# Patient Record
Sex: Female | Born: 1975 | Race: White | Hispanic: No | Smoking: Current every day smoker
Health system: Southern US, Community
[De-identification: ages and names within clinical notes are randomized; demographics above are authoritative.]

## PROBLEM LIST (undated history)

## (undated) DIAGNOSIS — T8859XA Other complications of anesthesia, initial encounter: Secondary | ICD-10-CM

## (undated) DIAGNOSIS — R112 Nausea with vomiting, unspecified: Secondary | ICD-10-CM

## (undated) DIAGNOSIS — R519 Headache, unspecified: Secondary | ICD-10-CM

## (undated) DIAGNOSIS — Z9889 Other specified postprocedural states: Secondary | ICD-10-CM

## (undated) DIAGNOSIS — M1990 Unspecified osteoarthritis, unspecified site: Secondary | ICD-10-CM

## (undated) DIAGNOSIS — I10 Essential (primary) hypertension: Secondary | ICD-10-CM

## (undated) DIAGNOSIS — T4145XA Adverse effect of unspecified anesthetic, initial encounter: Secondary | ICD-10-CM

## (undated) HISTORY — PX: TONSILLECTOMY: SUR1361

## (undated) HISTORY — PX: KNEE ARTHROSCOPY: SUR90

## (undated) HISTORY — PX: CHOLECYSTECTOMY: SHX55

---
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
## 1999-03-27 HISTORY — PX: NASAL SINUS SURGERY: SHX719

## 2002-03-26 HISTORY — PX: DIAGNOSTIC LAPAROSCOPY: SUR761

## 2004-07-26 DIAGNOSIS — Z3400 Encounter for supervision of normal first pregnancy, unspecified trimester: Secondary | ICD-10-CM | POA: Insufficient documentation

## 2016-05-30 ENCOUNTER — Ambulatory Visit (INDEPENDENT_AMBULATORY_CARE_PROVIDER_SITE_OTHER): Payer: BLUE CROSS/BLUE SHIELD | Admitting: Orthopaedic Surgery

## 2016-05-30 ENCOUNTER — Ambulatory Visit (INDEPENDENT_AMBULATORY_CARE_PROVIDER_SITE_OTHER): Payer: Self-pay

## 2016-05-30 ENCOUNTER — Encounter (INDEPENDENT_AMBULATORY_CARE_PROVIDER_SITE_OTHER): Payer: Self-pay | Admitting: Orthopaedic Surgery

## 2016-05-30 VITALS — BP 149/69 | HR 90 | Resp 14 | Ht 71.0 in | Wt 240.0 lb

## 2016-05-30 DIAGNOSIS — M25561 Pain in right knee: Secondary | ICD-10-CM

## 2016-05-30 DIAGNOSIS — G8929 Other chronic pain: Secondary | ICD-10-CM

## 2016-05-30 MED ORDER — BUPIVACAINE HCL 0.5 % IJ SOLN
3.0000 mL | INTRAMUSCULAR | Status: AC | PRN
  Administered 2016-05-30: 3 mL via INTRA_ARTICULAR

## 2016-05-30 MED ORDER — LIDOCAINE HCL 1 % IJ SOLN
5.0000 mL | INTRAMUSCULAR | Status: AC | PRN
  Administered 2016-05-30: 5 mL

## 2016-05-30 MED ORDER — METHYLPREDNISOLONE ACETATE 40 MG/ML IJ SUSP
80.0000 mg | INTRAMUSCULAR | Status: AC | PRN
  Administered 2016-05-30: 80 mg

## 2016-05-30 NOTE — Progress Notes (Signed)
Office Visit Note   Patient: Lauren Hicks           Date of Birth: 03/09/1976           MRN: [id number] Visit Date: 05/30/2016              Requested by: No referring provider defined for this encounter. PCP: No primary care provider on file.   Assessment & Plan: Visit Diagnoses: Tricompartmental degenerative arthrosis right knee with predominant findings about the patellofemoral joint   Plan:  Orders are injection about lateral patella, MRI scan, follow-up after scan. I think Herbert SetaHeather might have a loose body and may be a candidate for an arthroscopy. Tramadol for pain at night Follow-Up Instructions: No Follow-up on file.   Orders:  No orders of the defined types were placed in this encounter.  No orders of the defined types were placed in this encounter.     Procedures: Large Joint Inj Date/Time: 05/30/2016 3:31 PM Performed by: Valeria BatmanWHITFIELD, Rio Kidane W Authorized by: Valeria BatmanWHITFIELD, Paton Crum W   Consent Given by:  Patient Timeout: prior to procedure the correct patient, procedure, and site was verified   Indications:  Pain and joint swelling Location:  Knee Site:  R knee Prep: patient was prepped and draped in usual sterile fashion   Needle Size:  25 G Needle Length:  1.5 inches Approach:  Anteromedial Ultrasound Guidance: No   Fluoroscopic Guidance: No   Arthrogram: No   Medications:  5 mL lidocaine 1 %; 80 mg methylPREDNISolone acetate 40 MG/ML; 3 mL bupivacaine 0.5 % Aspiration Attempted: No   Patient tolerance:  Patient tolerated the procedure well with no immediate complications     Clinical Data: No additional findings.   Subjective: Chief Complaint  Patient presents with  . Right Knee - Pain    Pt presents with Right knee pain with 2 arthroscopic surgeries on right knee. One at 41 yrs old and one in 2008. Pain is increasing worse the last 3 weeks and is "locked". Pt relates she has "popping", "gridning" and "giving way".  Unable to walk any distance without  pain.  Pt has not tried cortisone injections in the past, doesn't remember when.  Uses ice, heat, elevation with no relief. Pain travels from lateral knee to achilles area.  Knee arthroscopy in 2008 at [facility] at that time she had lateral patellar tracking and chondromalacia of the patella and now she is experiencing recurrent popping clicking and catching to the point of compromise  Review of Systems   Objective: Vital Signs: There were no vitals taken for this visit.  Physical Exam  Ortho Exam knee exam with small effusion. Significant crepitation along the lateral patella facet with what might be a loose cartilaginous loose body. Considerable tilting of the bed the patella laterally. Some medial lateral joint pain. Difficulty with flexion-extension related to the pain about the patella but does have full extension and flexes over 100. No instability. Pain neurovascular exam intact distally.  Specialty Comments:  No specialty comments available.  Imaging: No results found.   PMFS History: There are no active problems to display for this patient.  No past medical history on file.  No family history on file.  No past surgical history on file. Social History   Occupational History  . Not on file.   Social History Main Topics  . Smoking status: Not on file  . Smokeless tobacco: Not on file  . Alcohol use Not on file  . Drug  use: Unknown  . Sexual activity: Not on file

## 2016-06-05 ENCOUNTER — Encounter: Payer: Self-pay | Admitting: Orthopaedic Surgery

## 2016-06-06 ENCOUNTER — Ambulatory Visit (INDEPENDENT_AMBULATORY_CARE_PROVIDER_SITE_OTHER): Payer: BLUE CROSS/BLUE SHIELD | Admitting: Orthopaedic Surgery

## 2016-06-08 ENCOUNTER — Other Ambulatory Visit (INDEPENDENT_AMBULATORY_CARE_PROVIDER_SITE_OTHER): Payer: Self-pay

## 2016-06-08 MED ORDER — TRAMADOL HCL 50 MG PO TABS: 50.0000 mg | ORAL_TABLET | Freq: Two times a day (BID) | ORAL | 0 refills | Status: DC

## 2016-06-13 ENCOUNTER — Ambulatory Visit (INDEPENDENT_AMBULATORY_CARE_PROVIDER_SITE_OTHER): Payer: BLUE CROSS/BLUE SHIELD | Admitting: Orthopaedic Surgery

## 2016-06-20 ENCOUNTER — Ambulatory Visit (INDEPENDENT_AMBULATORY_CARE_PROVIDER_SITE_OTHER): Payer: BLUE CROSS/BLUE SHIELD | Admitting: Orthopaedic Surgery

## 2016-06-27 ENCOUNTER — Encounter (INDEPENDENT_AMBULATORY_CARE_PROVIDER_SITE_OTHER): Payer: Self-pay | Admitting: Orthopaedic Surgery

## 2016-06-27 ENCOUNTER — Ambulatory Visit (INDEPENDENT_AMBULATORY_CARE_PROVIDER_SITE_OTHER): Payer: BLUE CROSS/BLUE SHIELD | Admitting: Orthopaedic Surgery

## 2016-06-27 VITALS — BP 137/92 | HR 89 | Ht 71.0 in | Wt 240.0 lb

## 2016-06-27 DIAGNOSIS — M25561 Pain in right knee: Secondary | ICD-10-CM

## 2016-06-27 DIAGNOSIS — G8929 Other chronic pain: Secondary | ICD-10-CM | POA: Diagnosis not present

## 2016-06-27 NOTE — Progress Notes (Signed)
Office Visit Note   Patient: Lauren Hicks           Date of Birth: October 20, 1975           MRN: [id number] Visit Date: 06/27/2016              Requested by: No referring provider defined for this encounter. PCP: No PCP Per Patient   Assessment & Plan: Visit Diagnoses: Chronic right knee pain that seems to be a combination of several factors. There are 2 loose bodies measuring 8 and 9 mm in diameter per the MRI scan. There is moderate degenerative change in the medial lateral compartment and severe arthritic change at the patellofemoral joint where there is bone-on-bone along the lateral patella facet and lateral femoral trochlear. There may also be old tear of the lateral meniscus   Plan: Long discussion regarding the findings by MRI scan. She would like to proceed with knee arthroscopy. I discussed this in some detail with her. 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 I might decide to perform a lateral release  Follow-Up Instructions: No Follow-up on file.   Orders:  No orders of the defined types were placed in this encounter.  No orders of the defined types were placed in this encounter.     Procedures: No procedures performed   Clinical Data: No additional findings. M there are moderately degenerative changes throughout the medial lateral compartments. 2 loose bodies were identified along the lateral femoral condyle measuring 0.8 and 0.9 cm in diameter mild subchondral edema in the anterior aspect of the lateral tibial plateau was noted associated with bulky tricompartmental osteophytosis. RI scan was performed at [facility] on 313. There is a multiseptated cyst in the anterior aspect of the lateral compartment measuring 2.7 cm transversely by 1 cm anterior to posterior and 0.9 cm  craniocaudal consistent with apparent meniscal cyst related to capsular surface tear. Ligaments are intact. The patellofemoral joint was completely denuded. There is bone-on-bone joint space narrowing between the lateral patella facet and the lateral femoral trochlear.  Subjective: No chief complaint on file.   [name] is a 41 year old female here today for MRI results    with Tricompartmental degenerative arthrosis right knee with predominant findings about the patellofemoral joint  injection about lateral patella, , Tamadol for pain at night    [name] head several days relief of her knee pain after the cortisone injection only to have it recur. Discussed the reason for obtaining the MRI scan  Review of Systems   Objective: Vital Signs: There were no vitals taken for this visit.  Physical Exam  Ortho Exam no change in the right knee exam from prior visits. Very small effusion. No instability. Negative anterior drawer sign. Mild lateral joint pain. No medial joint pain. Significant patella crepitation with flexion and extension. The loose bodies are palpable the lateral patellofemoral joint where there is some local tenderness. Flexion and flexion over 105. No swelling distally.  Specialty Comments:  No specialty comments available.  Imaging: No results found.   PMFS History: There are no active problems to display for this patient.  No past medical history on file.  Family History  Problem Relation Age of Onset  . Diabetes Mother   . Broken bones Mother   .  Osteoarthritis Father     Past Surgical History:  Procedure Laterality Date  . CESAREAN SECTION    . CHOLECYSTECTOMY    . TONSILLECTOMY     Social History   Occupational History  . Not on file.   Social History Main Topics  . Smoking status: Current Every Day Smoker    Types: Cigarettes  . Smokeless tobacco: Never Used  . Alcohol use No  . Drug use: No  . Sexual activity: No

## 2016-06-28 ENCOUNTER — Telehealth (INDEPENDENT_AMBULATORY_CARE_PROVIDER_SITE_OTHER): Payer: Self-pay | Admitting: Orthopaedic Surgery

## 2016-06-28 NOTE — Telephone Encounter (Signed)
LVM for pt to call to schedule surgery. Will try pt again at a later time. 

## 2016-07-02 ENCOUNTER — Telehealth (INDEPENDENT_AMBULATORY_CARE_PROVIDER_SITE_OTHER): Payer: Self-pay | Admitting: Orthopaedic Surgery

## 2016-07-02 NOTE — Telephone Encounter (Signed)
LVM with pt to call to schedule surgery. Will try pt again at a later time. 

## 2016-07-03 ENCOUNTER — Telehealth (INDEPENDENT_AMBULATORY_CARE_PROVIDER_SITE_OTHER): Payer: Self-pay | Admitting: Orthopaedic Surgery

## 2016-07-03 NOTE — Telephone Encounter (Signed)
LVM x3 with pt trying to schedule surgery. Will await patients phone call to set up surgery.

## 2016-08-13 ENCOUNTER — Other Ambulatory Visit (INDEPENDENT_AMBULATORY_CARE_PROVIDER_SITE_OTHER): Payer: Self-pay | Admitting: Orthopaedic Surgery

## 2016-08-16 ENCOUNTER — Telehealth (INDEPENDENT_AMBULATORY_CARE_PROVIDER_SITE_OTHER): Payer: Self-pay | Admitting: Orthopaedic Surgery

## 2016-08-16 NOTE — Telephone Encounter (Signed)
Patient called wanting to know if her prescription refill has been signed and faxed back to the pharmacy for her tramadol.  [facility] uses [facility] [address] [place], [place].  [phone].  Thank you.

## 2016-08-16 NOTE — Telephone Encounter (Signed)
lvmom that we cannot send rx she has to p/up

## 2016-08-23 ENCOUNTER — Encounter: Payer: Self-pay | Admitting: Orthopaedic Surgery

## 2016-08-23 DIAGNOSIS — M2341 Loose body in knee, right knee: Secondary | ICD-10-CM | POA: Diagnosis not present

## 2016-08-23 DIAGNOSIS — S83281A Other tear of lateral meniscus, current injury, right knee, initial encounter: Secondary | ICD-10-CM | POA: Diagnosis not present

## 2016-08-23 DIAGNOSIS — M2241 Chondromalacia patellae, right knee: Secondary | ICD-10-CM | POA: Diagnosis not present

## 2016-08-29 ENCOUNTER — Ambulatory Visit (INDEPENDENT_AMBULATORY_CARE_PROVIDER_SITE_OTHER): Payer: BLUE CROSS/BLUE SHIELD | Admitting: Orthopaedic Surgery

## 2016-08-29 ENCOUNTER — Encounter (INDEPENDENT_AMBULATORY_CARE_PROVIDER_SITE_OTHER): Payer: Self-pay | Admitting: Orthopaedic Surgery

## 2016-08-29 VITALS — BP 132/84 | HR 80 | Resp 14 | Ht 71.0 in | Wt 240.0 lb

## 2016-08-29 DIAGNOSIS — G8929 Other chronic pain: Secondary | ICD-10-CM

## 2016-08-29 DIAGNOSIS — M25561 Pain in right knee: Secondary | ICD-10-CM

## 2016-08-29 MED ORDER — OXYCODONE-ACETAMINOPHEN 5-325 MG PO TABS: 1.0000 | ORAL_TABLET | Freq: Three times a day (TID) | ORAL | 0 refills | Status: DC | PRN

## 2016-08-29 NOTE — Progress Notes (Signed)
   Post-Op Visit Note   Patient: Lauren Hicks           Date of Birth: 11/27/1975           MRN: [id number] Visit Date: 08/29/2016 PCP: Patient, No Pcp Per   Assessment & Plan:  Chief Complaint:  Chief Complaint  Patient presents with  . Right Knee - Routine Post Op    Lauren Hicks is 41 y o that is 1 week status post RIGHT knee arthroscopy. She ambulates with crutches.Pt relates the Oxycodone makes her "wired" and hard to sleep.   Visit Diagnoses:  1. Chronic pain of right knee    Still having some pain 1 week after he had knee arthroscopy. There was a tear of the medial meniscus but also considerable degenerative changes in all 3 compartments Plan: I aspirated the right knee of about 10 mL of bloody fluid. No evidence of infection. Progressive weightbearing office 1 week. Renew prescription for hydrocodone  Follow-Up Instructions: Return in about 1 week (around 09/05/2016).   Orders:  No orders of the defined types were placed in this encounter.  Meds ordered this encounter  Medications  . oxyCODONE-acetaminophen (PERCOCET/ROXICET) 5-325 MG tablet    Sig: Take 1-2 tablets by mouth every 8 (eight) hours as needed for severe pain.    Dispense:  30 tablet    Refill:  0    Imaging: No results found.  PMFS History: There are no active problems to display for this patient.  History reviewed. No pertinent past medical history.  Family History  Problem Relation Age of Onset  . Diabetes Mother   . Broken bones Mother   . Osteoarthritis Father     Past Surgical History:  Procedure Laterality Date  . CESAREAN SECTION    . CHOLECYSTECTOMY    . TONSILLECTOMY     Social History   Occupational History  . Not on file.   Social History Main Topics  . Smoking status: Current Every Day Smoker    Types: Cigarettes  . Smokeless tobacco: Never Used  . Alcohol use No  . Drug use: No  . Sexual activity: No

## 2016-09-05 ENCOUNTER — Encounter (INDEPENDENT_AMBULATORY_CARE_PROVIDER_SITE_OTHER): Payer: Self-pay | Admitting: Orthopaedic Surgery

## 2016-09-05 ENCOUNTER — Ambulatory Visit (INDEPENDENT_AMBULATORY_CARE_PROVIDER_SITE_OTHER): Payer: BLUE CROSS/BLUE SHIELD | Admitting: Orthopaedic Surgery

## 2016-09-05 VITALS — BP 127/80 | HR 83 | Ht 71.0 in | Wt 240.0 lb

## 2016-09-05 DIAGNOSIS — M25561 Pain in right knee: Secondary | ICD-10-CM

## 2016-09-05 DIAGNOSIS — G8929 Other chronic pain: Secondary | ICD-10-CM

## 2016-09-05 NOTE — Progress Notes (Signed)
   Office Visit Note   Patient: Lauren Hicks           Date of Birth: 01/07/1976           MRN: [id number] Visit Date: 09/05/2016              Requested by: No referring provider defined for this encounter. PCP: Patient, No Pcp Per   Assessment & Plan: Visit Diagnoses:  1. Chronic pain of right knee   Several weeks status post right knee arthroscopy for a torn medial meniscus. There were considerable degenerative changes in the medial compartment. Feeling much better this week Plan: Physical therapy for range of motion right knee, progressively increase weightbearing and discontinue crutches. Office 2 weeks  Follow-Up Instructions: Return in about 2 weeks (around 09/19/2016).   Orders:  No orders of the defined types were placed in this encounter.  No orders of the defined types were placed in this encounter.     Procedures: No procedures performed   Clinical Data: No additional findings.   Subjective: Chief Complaint  Patient presents with  . Right Knee - Routine Post Op    Ms. Lauren Hicks is status post 2 weeks R knee arthroscopy. She relates she is doing much better.   Feeling much better this week after aspiration of the right knee last week. No fever or chills. No calf pain. Still using crutches. 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 No calf pain. No distal edema. Neurovascular exam intact. Lacks full extension related to a small knee effusion and flexes about 100. Normal medial joint pain.  Specialty Comments:  No specialty comments available.  Imaging: No results found.   PMFS History: There are no active problems to display for this patient.  History reviewed. No pertinent past medical history.  Family History   Problem Relation Age of Onset  . Diabetes Mother   . Broken bones Mother   . Osteoarthritis Father     Past Surgical History:  Procedure Laterality Date  . CESAREAN SECTION    . CHOLECYSTECTOMY    . TONSILLECTOMY     Social History   Occupational History  . Not on file.   Social History Main Topics  . Smoking status: Current Every Day Smoker    Types: Cigarettes  . Smokeless tobacco: Never Used  . Alcohol use No  . Drug use: No  . Sexual activity: No     Lauren BatmanPeter W Gardiner Espana, MD   Note - This record has been created using [employer] software.  Chart creation errors have been sought, but may not always  have been located. Such creation errors do not reflect on  the standard of medical care.

## 2016-09-19 ENCOUNTER — Ambulatory Visit (INDEPENDENT_AMBULATORY_CARE_PROVIDER_SITE_OTHER): Payer: BLUE CROSS/BLUE SHIELD | Admitting: Orthopaedic Surgery

## 2016-09-19 DIAGNOSIS — M25561 Pain in right knee: Secondary | ICD-10-CM

## 2016-09-19 DIAGNOSIS — G8929 Other chronic pain: Secondary | ICD-10-CM

## 2016-09-19 NOTE — Progress Notes (Signed)
   Post-Op Visit Note   Patient: Lauren Hicks           Date of Birth: 07/28/1975           MRN: [id number] Visit Date: 09/19/2016 PCP: Patient, No Pcp Per   Assessment & Plan:  Chief Complaint: No chief complaint on file.  Visit Diagnoses:  1. Chronic pain of right knee   3 weeks status post right knee arthroscopy for the tear the medial meniscus. Also were noted tricompartmental degenerative changes  Plan: Progressive weightbearing as tolerated, discontinue crutches, exercises office 1 month. No evidence of DVT. No swelling distally. Minimal effusion Follow-Up Instructions: Return in about 1 month (around 10/19/2016).   Orders:  No orders of the defined types were placed in this encounter.  No orders of the defined types were placed in this encounter.   Imaging: No results found.  PMFS History: There are no active problems to display for this patient.  No past medical history on file.  Family History  Problem Relation Age of Onset  . Diabetes Mother   . Broken bones Mother   . Osteoarthritis Father     Past Surgical History:  Procedure Laterality Date  . CESAREAN SECTION    . CHOLECYSTECTOMY    . TONSILLECTOMY     Social History   Occupational History  . Not on file.   Social History Main Topics  . Smoking status: Current Every Day Smoker    Types: Cigarettes  . Smokeless tobacco: Never Used  . Alcohol use No  . Drug use: No  . Sexual activity: No

## 2016-10-24 ENCOUNTER — Ambulatory Visit (INDEPENDENT_AMBULATORY_CARE_PROVIDER_SITE_OTHER): Payer: BLUE CROSS/BLUE SHIELD | Admitting: Orthopaedic Surgery

## 2016-10-24 DIAGNOSIS — M25561 Pain in right knee: Secondary | ICD-10-CM

## 2016-10-24 DIAGNOSIS — G8929 Other chronic pain: Secondary | ICD-10-CM

## 2016-10-24 MED ORDER — METHYLPREDNISOLONE ACETATE 40 MG/ML IJ SUSP
80.0000 mg | INTRAMUSCULAR | Status: AC | PRN
  Administered 2016-10-24: 80 mg

## 2016-10-24 MED ORDER — BUPIVACAINE HCL 0.5 % IJ SOLN
3.0000 mL | INTRAMUSCULAR | Status: AC | PRN
  Administered 2016-10-24: 3 mL via INTRA_ARTICULAR

## 2016-10-24 MED ORDER — LIDOCAINE HCL 1 % IJ SOLN
5.0000 mL | INTRAMUSCULAR | Status: AC | PRN
  Administered 2016-10-24: 5 mL

## 2016-10-24 NOTE — Progress Notes (Signed)
Office Visit Note   Patient: Alroy DustHeather L Ibe           Date of Birth: 03/31/1975           MRN: [id number] Visit Date: 10/24/2016              Requested by: No referring provider defined for this encounter. PCP: Patient, No Pcp Per   Assessment & Plan: Visit Diagnoses:  1. Chronic pain of right knee   2 months status post right knee arthroscopy with tear of medial meniscus and tricompartmental degenerative arthritis  Plan: Aspirate right knee, cortisone injection. See back in the office 1 month  Follow-Up Instructions: Return in about 1 month (around 11/24/2016).   Orders:  No orders of the defined types were placed in this encounter.  No orders of the defined types were placed in this encounter.     Procedures: Large Joint Inj Date/Time: 10/24/2016 12:19 PM Performed by: Valeria BatmanWHITFIELD, Leonda Cristo W Authorized by: Valeria BatmanWHITFIELD, Seven Dollens W   Consent Given by:  Patient Timeout: prior to procedure the correct patient, procedure, and site was verified   Indications:  Pain and joint swelling Location:  Knee Site:  R knee Prep: patient was prepped and draped in usual sterile fashion   Needle Size:  25 G Needle Length:  1.5 inches Approach:  Anteromedial Ultrasound Guidance: No   Fluoroscopic Guidance: No   Arthrogram: No   Medications:  3 mL bupivacaine 0.5 %; 5 mL lidocaine 1 %; 80 mg methylPREDNISolone acetate 40 MG/ML Aspiration Attempted: Yes   Aspirate amount (mL):  16 Aspirate:  Blood-tinged Patient tolerance:  Patient tolerated the procedure well with no immediate complications      Clinical Data: No additional findings.   Subjective: Chief Complaint  Patient presents with  . Right Knee - Routine Post Op  2 months status post right knee arthroscopy as previously outlined. She is able to drive with less discomfort but still having some pain when she is on her feet for a length of time. No fever or chills.  HPI  Review of Systems   Objective: Vital Signs: There  were no vitals taken for this visit.  Physical Exam  Ortho Exam positive effusion right knee with mild medial joint pain. No patellar discomfort. Full extension. Flexion to 90. No popliteal pain or mass. No calf discomfort. Neurovascular exam intact. No swelling distally area and straight leg raise negative. Painless range of motion both hips  Specialty Comments:  No specialty comments available.  Imaging: No results found.   PMFS History: There are no active problems to display for this patient.  No past medical history on file.  Family History  Problem Relation Age of Onset  . Diabetes Mother   . Broken bones Mother   . Osteoarthritis Father     Past Surgical History:  Procedure Laterality Date  . CESAREAN SECTION    . CHOLECYSTECTOMY    . TONSILLECTOMY     Social History   Occupational History  . Not on file.   Social History Main Topics  . Smoking status: Current Every Day Smoker    Types: Cigarettes  . Smokeless tobacco: Never Used  . Alcohol use No  . Drug use: No  . Sexual activity: No     Valeria BatmanPeter W Caz Weaver, MD   Note - This record has been created using [employer] software.  Chart creation errors have been sought, but may not always  have been located. Such creation errors do not  reflect on  the standard of medical care.

## 2016-11-28 ENCOUNTER — Ambulatory Visit (INDEPENDENT_AMBULATORY_CARE_PROVIDER_SITE_OTHER): Payer: BLUE CROSS/BLUE SHIELD | Admitting: Orthopaedic Surgery

## 2016-11-28 ENCOUNTER — Encounter (INDEPENDENT_AMBULATORY_CARE_PROVIDER_SITE_OTHER): Payer: Self-pay | Admitting: Orthopaedic Surgery

## 2016-11-28 VITALS — BP 135/83 | HR 90 | Ht 71.0 in | Wt 225.0 lb

## 2016-11-28 DIAGNOSIS — M1711 Unilateral primary osteoarthritis, right knee: Secondary | ICD-10-CM

## 2016-11-28 NOTE — Progress Notes (Signed)
   Office Visit Note   Patient: Lauren Hicks           Date of Birth: 06/25/1975           MRN: [id number] Visit Date: 11/28/2016              Requested by: No referring provider defined for this encounter. PCP: Patient, No Pcp Per   Assessment & Plan: Visit Diagnoses:  1. Unilateral primary osteoarthritis, right knee     Plan: long discussion re wt loss, exercises and extent of OA right knee. Cortisone not helpful. Will pre cert for euflexxa or equivalent  Follow-Up Instructions: Return premcert visco.   Orders:  No orders of the defined types were placed in this encounter.  No orders of the defined types were placed in this encounter.     Procedures: No procedures performed   Clinical Data: No additional findings.   Subjective: Chief Complaint  Patient presents with  . Right Knee - Routine Post Op    Ms. Lauren Hicks is a 41 year old that is 3 month status post R knee arthroscopy. She relates the knee has been hurting and the cortisone injection did not work long,    HPI  Review of Systems  Constitutional: Positive for fatigue. Negative for chills and fever.  Eyes: Negative for itching.  Respiratory: Negative for chest tightness and shortness of breath.   Cardiovascular: Positive for leg swelling. Negative for chest pain and palpitations.  Gastrointestinal: Negative for blood in stool, constipation and diarrhea.  Musculoskeletal: Negative for back pain, joint swelling, neck pain and neck stiffness.  Neurological: Positive for light-headedness. Negative for dizziness, weakness, numbness and headaches.  Hematological: Does not bruise/bleed easily.  Psychiatric/Behavioral: Positive for sleep disturbance. The patient is not nervous/anxious.      Objective: Vital Signs: BP 135/83   Pulse 90   Ht 5\' 11"  (1.803 m)   Wt 225 lb (102.1 kg)   BMI 31.38 kg/m   Physical Exam  Ortho Examright knee without increased heat. Skin intact. No effusion. ROM 0-105.  Positive patella crepitation and pain with compression. Mild med and lateral joint pain. No calf pain or distal edema. N/V intact  Specialty Comments:  No specialty comments available.  Imaging: No results found.   PMFS History: There are no active problems to display for this patient.  History reviewed. No pertinent past medical history.  Family History  Problem Relation Age of Onset  . Diabetes Mother   . Broken bones Mother   . Osteoarthritis Father   . Pancreatitis Father     Past Surgical History:  Procedure Laterality Date  . CESAREAN SECTION    . CHOLECYSTECTOMY    . TONSILLECTOMY     Social History   Occupational History  . Not on file.   Social History Main Topics  . Smoking status: Current Every Day Smoker    Types: Cigarettes  . Smokeless tobacco: Never Used  . Alcohol use No  . Drug use: No  . Sexual activity: No

## 2017-11-01 DIAGNOSIS — Z8742 Personal history of other diseases of the female genital tract: Secondary | ICD-10-CM | POA: Insufficient documentation

## 2018-07-14 ENCOUNTER — Ambulatory Visit (INDEPENDENT_AMBULATORY_CARE_PROVIDER_SITE_OTHER): Payer: Self-pay

## 2018-07-14 ENCOUNTER — Encounter (INDEPENDENT_AMBULATORY_CARE_PROVIDER_SITE_OTHER): Payer: Self-pay | Admitting: Family Medicine

## 2018-07-14 ENCOUNTER — Ambulatory Visit (INDEPENDENT_AMBULATORY_CARE_PROVIDER_SITE_OTHER): Payer: BLUE CROSS/BLUE SHIELD | Admitting: Family Medicine

## 2018-07-14 ENCOUNTER — Other Ambulatory Visit: Payer: Self-pay

## 2018-07-14 DIAGNOSIS — R002 Palpitations: Secondary | ICD-10-CM | POA: Insufficient documentation

## 2018-07-14 DIAGNOSIS — M25562 Pain in left knee: Secondary | ICD-10-CM

## 2018-07-14 MED ORDER — NABUMETONE 750 MG PO TABS
750.0000 mg | ORAL_TABLET | Freq: Two times a day (BID) | ORAL | 6 refills | Status: DC | PRN
Start: 2018-07-14 — End: 2018-09-17

## 2018-07-14 MED ORDER — TRAMADOL HCL 50 MG PO TABS: 50.0000 mg | ORAL_TABLET | Freq: Four times a day (QID) | ORAL | 0 refills | Status: DC | PRN

## 2018-07-14 NOTE — Progress Notes (Signed)
   Office Visit Note   Patient: Lauren Hicks           Date of Birth: 12-Jul-1975           MRN: [id number] Visit Date: 07/14/2018 Requested by: No referring provider defined for this encounter. PCP: Patient, No Pcp Per  Subjective: Chief Complaint  Patient presents with  . Left Knee - Pain    Pain started evening of 07/11/2018, after doing yardwork earlier that day. Hurts to bear weight. Pain medial aspect. Swelling. H/o arthroscopic surgery x 2 (cartilage and MCL tear).    HPI: She is here with left knee pain.  Symptoms started 3 days ago after doing yard work.  She does not recall any injury during the day, but it was very swollen and painful later on and she can hardly bear weight.  Pain is on the medial aspect, no locking or catching but it feels like it wants to give way.  She does have a history of arthritis in her knees and also a history of possible pseudogout in the right knee.               ROS: No fevers or chills, no respiratory symptoms.  All other systems were reviewed and are negative.  Objective: Vital Signs: There were no vitals taken for this visit.  Physical Exam:  General:  Alert and oriented, in no acute distress. Pulm:  Breathing unlabored. Psy:  Normal mood, congruent affect. Skin: No rash on her skin, no erythema. Right knee: 2+ effusion with slight warmth.  Full active extension, flexion limited to about 90 degrees.  Extremely tender on the medial joint line, no palpable click with McMurray's.  Imaging: X-rays left knee: Moderate to severe tricompartmental DJD, especially in the patellofemoral joint.  Assessment & Plan: 1.  Left knee pain with effusion, probably due to DJD but cannot rule out pseudogout. -Discussed options with patient, she is reluctant to try cortisone injection.  We will try anti-inflammatory, tramadol as needed.  We will also attempt to get approval for bilateral gel injections for her knees.     Procedures: No procedures  performed  No notes on file     PMFS History: Patient Active Problem List   Diagnosis Date Noted  . Palpitations 07/14/2018  . History of endometriosis 11/01/2017  . Abnormal maternal glucose tolerance, antepartum 07/26/2004  . Supervision of normal first pregnancy 07/26/2004   History reviewed. No pertinent past medical history.  Family History  Problem Relation Age of Onset  . Diabetes Mother   . Broken bones Mother   . Osteoarthritis Father   . Pancreatitis Father     Past Surgical History:  Procedure Laterality Date  . CESAREAN SECTION    . CHOLECYSTECTOMY    . TONSILLECTOMY     Social History   Occupational History  . Not on file  Tobacco Use  . Smoking status: Current Every Day Smoker    Types: Cigarettes  . Smokeless tobacco: Never Used  Substance and Sexual Activity  . Alcohol use: No  . Drug use: No  . Sexual activity: Never

## 2018-07-22 ENCOUNTER — Telehealth (INDEPENDENT_AMBULATORY_CARE_PROVIDER_SITE_OTHER): Payer: Self-pay

## 2018-07-22 NOTE — Telephone Encounter (Signed)
Submitted VOB for SynviscOne, bilateral knee. 

## 2018-07-23 ENCOUNTER — Telehealth (INDEPENDENT_AMBULATORY_CARE_PROVIDER_SITE_OTHER): Payer: Self-pay

## 2018-07-23 NOTE — Telephone Encounter (Signed)
Patient's insurance [employer] does not cover gel injections.  Please advise on the next step for patient.  Thank You.

## 2018-07-24 NOTE — Telephone Encounter (Signed)
Cortisone would be approved by insurance. PRP is an option that would not be approved, but would probably cost less than gel injection.

## 2018-07-25 NOTE — Telephone Encounter (Signed)
I advised the patient. She is not interested in cortisone injections - "they just don't work." She will check back with Dr. Cleophas Dunker to see if knee replacement is possible for her now.

## 2018-07-31 ENCOUNTER — Other Ambulatory Visit (INDEPENDENT_AMBULATORY_CARE_PROVIDER_SITE_OTHER): Payer: Self-pay | Admitting: Family Medicine

## 2018-07-31 ENCOUNTER — Ambulatory Visit (INDEPENDENT_AMBULATORY_CARE_PROVIDER_SITE_OTHER): Payer: BLUE CROSS/BLUE SHIELD | Admitting: Family Medicine

## 2018-07-31 ENCOUNTER — Encounter: Payer: Self-pay | Admitting: Family Medicine

## 2018-07-31 ENCOUNTER — Other Ambulatory Visit: Payer: Self-pay

## 2018-07-31 DIAGNOSIS — M25562 Pain in left knee: Secondary | ICD-10-CM

## 2018-07-31 DIAGNOSIS — M2392 Unspecified internal derangement of left knee: Secondary | ICD-10-CM

## 2018-07-31 NOTE — Progress Notes (Signed)
   Office Visit Note   Patient: Lauren Hicks           Date of Birth: Jul 21, 1975           MRN: [id number] Visit Date: 07/31/2018 Requested by: No referring provider defined for this encounter. PCP: Patient, No Pcp Per  Subjective: Chief Complaint  Patient presents with  . Left Knee - Follow-up    Knee locked in mildly flexed position yesterday, with severe pain. Can move knee today, and put weight on it with crutches.    HPI: She is here with worsening left knee pain.  Yesterday without warning it locked in a flexed position.  After a while it unlocked, but it has been catching since then.  It is very painful to move it, and she feels concerned that she might fall if it locks while she is walking.  She is using crutches right now.              ROS: No fevers or chills, no respiratory symptoms.  All other systems were reviewed and are negative.  Objective: Vital Signs: There were no vitals taken for this visit.  Physical Exam:  General:  Alert and oriented, in no acute distress. Pulm:  Breathing unlabored. Psy:  Normal mood, congruent affect. Skin: No rash on her skin. Left knee: 1-2+ effusion with no warmth or erythema.  Very painful with active and passive range of motion, unable to fully extend it today.  Imaging: None today.  Assessment & Plan: 1.  Left knee effusion with locking, concerning for loose body or meniscus tear. -Discussed options with patient and elected to aspirate the knee today, injected with lidocaine, and see how she does.  We will proceed with MRI scan if it continues to lock.     Procedures: Left knee aspiration: After sterile prep with Betadine, injected 3 cc 1% lidocaine without epinephrine, then aspirated 15 cc of blood tinged slightly hazy synovial fluid, then injected 8 cc 1% lidocaine without epinephrine intra-articularly.    PMFS History: Patient Active Problem List   Diagnosis Date Noted  . Palpitations 07/14/2018  . History of  endometriosis 11/01/2017  . Abnormal maternal glucose tolerance, antepartum 07/26/2004  . Supervision of normal first pregnancy 07/26/2004   History reviewed. No pertinent past medical history.  Family History  Problem Relation Age of Onset  . Diabetes Mother   . Broken bones Mother   . Osteoarthritis Father   . Pancreatitis Father     Past Surgical History:  Procedure Laterality Date  . CESAREAN SECTION    . CHOLECYSTECTOMY    . TONSILLECTOMY     Social History   Occupational History  . Not on file  Tobacco Use  . Smoking status: Current Every Day Smoker    Types: Cigarettes  . Smokeless tobacco: Never Used  Substance and Sexual Activity  . Alcohol use: No  . Drug use: No  . Sexual activity: Never

## 2018-08-12 ENCOUNTER — Other Ambulatory Visit: Payer: BLUE CROSS/BLUE SHIELD

## 2018-08-15 ENCOUNTER — Other Ambulatory Visit: Payer: Self-pay

## 2018-08-15 ENCOUNTER — Encounter: Payer: Self-pay | Admitting: Family Medicine

## 2018-08-15 ENCOUNTER — Ambulatory Visit (HOSPITAL_COMMUNITY)
Admission: RE | Admit: 2018-08-15 | Discharge: 2018-08-15 | Disposition: A | Discharge status: Home / Self Care | Payer: BLUE CROSS/BLUE SHIELD | Source: Ambulatory Visit | Attending: Family Medicine | Admitting: Family Medicine

## 2018-08-15 ENCOUNTER — Ambulatory Visit (INDEPENDENT_AMBULATORY_CARE_PROVIDER_SITE_OTHER): Payer: BLUE CROSS/BLUE SHIELD | Admitting: Family Medicine

## 2018-08-15 DIAGNOSIS — M25562 Pain in left knee: Secondary | ICD-10-CM

## 2018-08-15 DIAGNOSIS — M2392 Unspecified internal derangement of left knee: Secondary | ICD-10-CM

## 2018-08-15 MED ORDER — COLCHICINE 0.6 MG PO TABS: 0.6000 mg | ORAL_TABLET | Freq: Two times a day (BID) | ORAL | 3 refills | Status: AC | PRN

## 2018-08-15 MED ORDER — METHYLPREDNISOLONE 4 MG PO TBPK: ORAL_TABLET | ORAL | 0 refills | Status: DC

## 2018-08-15 MED ORDER — HYDROCODONE-ACETAMINOPHEN 5-325 MG PO TABS: 1.0000 | ORAL_TABLET | Freq: Four times a day (QID) | ORAL | 0 refills | Status: DC | PRN

## 2018-08-15 NOTE — Progress Notes (Signed)
   Office Visit Note   Patient: Lauren Hicks           Date of Birth: 1975/05/27           MRN: [id number] Visit Date: 08/15/2018 Requested by: No referring provider defined for this encounter. PCP: Patient, No Pcp Per  Subjective: Chief Complaint  Patient presents with  . Left Knee - Pain    Knee is swollen and painful. Hurts to straighten the leg. MRI has not been approved yet.    HPI: She is here with persistent left knee pain and locking.  Injection 2 weeks ago unfortunately did not help more than 2 days.  She still cannot extend her knee.  She is in severe pain.  MRI has not yet been approved.  She has had left knee arthroscopic surgery at age 43 and again at age 43.  Her most recent x-rays 2 weeks ago showed a substantial amount of patellofemoral DJD.                ROS: No fevers or chills.  No respiratory symptoms.  All other systems were reviewed and are negative.  Objective: Vital Signs: There were no vitals taken for this visit.  Physical Exam:  General:  Alert and oriented, in no acute distress. Pulm:  Breathing unlabored. Psy:  Normal mood, congruent affect. Skin: No rash on her skin. Left knee: Her knee range of motion is about 10 degrees flexion to 110 degrees flexion.  Unable to fully extend.  No warmth or erythema but she does have 2+ effusion.  Tender on the medial and lateral joint line.  Imaging: None today.  Assessment & Plan: 1.  Persistent left knee pain, locked, unable to fully extend. -I discussed her case with Dr. August Saucer today.  We will attempt to obtain a stat MRI scan.  If meniscal tear or loose body confirmed, then proceed with arthroscopic debridement next week. -She is given a Medrol Dosepak, colchicine and hydrocodone.     Procedures: No procedures performed  No notes on file     PMFS History: Patient Active Problem List   Diagnosis Date Noted  . Palpitations 07/14/2018  . History of endometriosis 11/01/2017  . Abnormal maternal  glucose tolerance, antepartum 07/26/2004  . Supervision of normal first pregnancy 07/26/2004   History reviewed. No pertinent past medical history.  Family History  Problem Relation Age of Onset  . Diabetes Mother   . Broken bones Mother   . Osteoarthritis Father   . Pancreatitis Father     Past Surgical History:  Procedure Laterality Date  . CESAREAN SECTION    . CHOLECYSTECTOMY    . TONSILLECTOMY     Social History   Occupational History  . Not on file  Tobacco Use  . Smoking status: Current Every Day Smoker    Types: Cigarettes  . Smokeless tobacco: Never Used  Substance and Sexual Activity  . Alcohol use: No  . Drug use: No  . Sexual activity: Never

## 2018-08-17 ENCOUNTER — Telehealth: Payer: Self-pay | Admitting: Family Medicine

## 2018-08-17 NOTE — Telephone Encounter (Signed)
MRI shows a substantial amount of arthritis in the knee.  There is a loose body in back of the knee, but not sure whether it's in a position that would explain the locking.  Will defer to Dr. August Saucer.

## 2018-08-21 ENCOUNTER — Ambulatory Visit: Payer: BLUE CROSS/BLUE SHIELD | Admitting: Orthopedic Surgery

## 2018-08-23 ENCOUNTER — Telehealth: Payer: Self-pay | Admitting: Adult Health

## 2018-08-23 NOTE — Telephone Encounter (Signed)
Spoke with patient regarding possible exposure to Covid 19. Discussed risks and availability of testing. Pt declined testing at thist ime but took the number if she changed her mind.

## 2018-08-27 ENCOUNTER — Other Ambulatory Visit: Payer: Self-pay

## 2018-08-27 ENCOUNTER — Ambulatory Visit (INDEPENDENT_AMBULATORY_CARE_PROVIDER_SITE_OTHER): Payer: BLUE CROSS/BLUE SHIELD | Admitting: Orthopedic Surgery

## 2018-08-27 ENCOUNTER — Encounter: Payer: Self-pay | Admitting: Orthopedic Surgery

## 2018-08-27 DIAGNOSIS — M1712 Unilateral primary osteoarthritis, left knee: Secondary | ICD-10-CM

## 2018-08-28 ENCOUNTER — Encounter: Payer: Self-pay | Admitting: Orthopedic Surgery

## 2018-08-28 NOTE — Progress Notes (Signed)
Office Visit Note   Patient: Lauren Hicks           Date of Birth: 11-23-1975           MRN: [id number] Visit Date: 08/27/2018 Requested by: No referring provider defined for this encounter. PCP: Patient, No Pcp Per  Subjective: Chief Complaint  Patient presents with  . Left Knee - Pain    HPI: [name] is a patient with bilateral knee arthritis.  She has had 2 left knee arthroscopies over 20 years ago.  She is had a history of left knee patella dislocation.  Recently she has had incredible difficulty walking as well as extending her leg.  She describes significant pain and inability to weight-bear.  She has had an MRI scan which is reviewed with the patient.  She has a posterior lateral loose body as well as end-stage severe patellofemoral arthritis.  The medial and lateral compartments are relatively spared.  The patella tilts and subluxate significantly laterally.  She has had injections which have not helped.  She reports pain at rest pain at night and pain which interferes with her activities of daily living.  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  Her father did have a history of DVT but she is never had any DVT herself with multiple lower extremity surgeries.  She states she does have varicose veins.  She does not have stairs at home.  She does have 2 children at home who can help her.              ROS: All systems reviewed are negative as they relate to the chief complaint within the history of present illness.  Patient denies  fevers or chills.   Assessment & Plan: Visit Diagnoses:  1. Unilateral primary osteoarthritis, left knee     Plan: Impression is left knee arthritis and right knee arthritis.  Patient is young and has increased body mass index.  The question at hand is whether or not arthroscopic intervention is going to be predictably helpful in diminishing her pain.  I think that is a long shot at best.  I do not see anything mechanically blocking extension in the front  of the knee.  She has had injections.  Her decision essentially is whether or not she wants to undergo knee replacement at a young age with a high likelihood of revision versus waiting this out and doing arthroscopic intervention which is of doubtful benefit based on MRI findings.  I think total knee replacement would be her best option at pain relief based on the severity of the subluxation of that patella.  I do not think patellofemoral replacement would be as certain to give her pain relief as total knee replacement.  In her case I would attempt to use press-fit components for optimal longevity.  The risk and benefits of the procedure are discussed including not limited to infection nerve vessel damage deep vein thrombosis.  We would likely have to use Xarelto for DVT prophylaxis instead of baby aspirin.  Patient understands the risk and benefits and wishes to proceed.  All questions answered.  Patient also states that her right knee hurts her all the time.  Arthroscopy on that knee was last done 2 years ago when she is heading for knee replacement with that knee as well.  Follow-Up Instructions: No follow-ups on file.   Orders:  No orders of the defined types were placed in this encounter.  No orders of the defined  types were placed in this encounter.     Procedures: No procedures performed   Clinical Data: No additional findings.  Objective: Vital Signs: There were no vitals taken for this visit.  Physical Exam:   Constitutional: Patient appears well-developed HEENT:  Head: Normocephalic Eyes:EOM are normal Neck: Normal range of motion Cardiovascular: Normal rate Pulmonary/chest: Effort normal Neurologic: Patient is alert Skin: Skin is warm Psychiatric: Patient has normal mood and affect    Ortho Exam: Ortho exam demonstrates antalgic gait to the left.  Pedal pulses palpable.  No groin pain with internal X rotation of the leg.  No effusion in that left knee.  She does have  trouble with full extension on that knee and she localizes her pain discretely medially.  She has significant patellofemoral crepitus bilaterally.  No groin pain with internal X rotation of either leg.  Specialty Comments:  No specialty comments available.  Imaging: No results found.   PMFS History: Patient Active Problem List   Diagnosis Date Noted  . Palpitations 07/14/2018  . History of endometriosis 11/01/2017  . Abnormal maternal glucose tolerance, antepartum 07/26/2004  . Supervision of normal first pregnancy 07/26/2004   History reviewed. No pertinent past medical history.  Family History  Problem Relation Age of Onset  . Diabetes Mother   . Broken bones Mother   . Osteoarthritis Father   . Pancreatitis Father     Past Surgical History:  Procedure Laterality Date  . CESAREAN SECTION    . CHOLECYSTECTOMY    . TONSILLECTOMY     Social History   Occupational History  . Not on file  Tobacco Use  . Smoking status: Current Every Day Smoker    Types: Cigarettes  . Smokeless tobacco: Never Used  Substance and Sexual Activity  . Alcohol use: No  . Drug use: No  . Sexual activity: Never

## 2018-09-08 ENCOUNTER — Encounter: Payer: Self-pay | Admitting: Orthopedic Surgery

## 2018-09-09 NOTE — Telephone Encounter (Signed)
Ok to stop colchicine after 7 days

## 2018-09-10 ENCOUNTER — Telehealth: Payer: Self-pay

## 2018-09-10 NOTE — Telephone Encounter (Signed)
Will hold message as reminder to tell Dr Marlou Sa about this on Friday morning.

## 2018-09-10 NOTE — Telephone Encounter (Signed)
I tried calling to schedule Peer to peer for patients surgery to try to get it approved but when I call the [phone] and enter the patients ID number it says that it is not a valid number and they will not help me until I have a the correct number.

## 2018-09-11 NOTE — Progress Notes (Signed)
[id number] - [place], [place] [address] Phone: [phone] Fax: [phone]      Your procedure is scheduled on Tuesday, 6/23.  Report to [facility] Entrance "A" at 12;10 PM., and check in at the Admitting office.  Call this number if you have problems the morning of surgery:  [phone]  Call [phone] if you have any questions prior to your surgery date Monday-Friday 8am-4pm    Remember:  Do not eat or drink after midnight Monday.  You may drink clear liquids until 11:10 am on Tuesday.   Clear liquids allowed are: Water, Non-Citrus Juices (without pulp), Carbonated Beverages, Clear Tea, Black Coffee Only, and Gatorade    Take these medicines the morning of surgery with A SIP OF WATER: HYDROcodone-acetaminophen (NORCO/VICODIN) if needed   7 days prior to surgery STOP taking any Aspirin (unless otherwise instructed by your surgeon), Aleve, Naproxen, Ibuprofen, Motrin, Advil, Goody's, BC's, all herbal medications, fish oil, and all vitamins.    The Morning of Surgery  Do not wear jewelry, make-up or nail polish.  Do not wear lotions, powders, or perfumes/colognes, or deodorant  Do not shave 48 hours prior to surgery.   Do not bring valuables to the hospital.  [facility] is not responsible for any belongings or valuables.  If you are a smoker, DO NOT Smoke 24 hours prior to surgery  IF you wear a CPAP at night please bring your mask, tubing, and machine the morning of surgery   Remember that you must have someone to transport you home after your surgery, and remain with you for 24 hours if you are discharged the same day.  Patients discharged the day of surgery will not be allowed to drive home.   Contacts, glasses, hearing aids, dentures or bridgework may not be worn into surgery.   For patients admitted to the hospital, discharge time will be determined by your treatment team.  Patients discharged  the day of surgery will not be allowed to drive home.    Special instructions:   [facility]- Preparing For Surgery  Before surgery, you can play an important role. Because skin is not sterile, your skin needs to be as free of germs as possible. You can reduce the number of germs on your skin by washing with CHG (chlorahexidine gluconate) Soap before surgery.  CHG is an antiseptic cleaner which kills germs and bonds with the skin to continue killing germs even after washing.    Oral Hygiene is also important to reduce your risk of infection.  Remember - BRUSH YOUR TEETH THE MORNING OF SURGERY WITH YOUR REGULAR TOOTHPASTE  Please do not use if you have an allergy to CHG or antibacterial soaps. If your skin becomes reddened/irritated stop using the CHG.  Do not shave (including legs and underarms) for at least 48 hours prior to first CHG shower. It is OK to shave your face.  Please follow these instructions carefully.   1. Shower the [employer] BEFORE SURGERY (Mon) and the MORNING OF SURGERY (Tues) with CHG Soap.   2. If you chose to wash your hair, wash your hair first as usual with your normal shampoo.  3. After you shampoo, rinse your hair and body thoroughly to remove the shampoo.  4. Use CHG as you would any other liquid soap. You can apply CHG directly to the skin and wash gently with a scrungie or a clean washcloth.   5. Apply the CHG Soap  to your body ONLY FROM THE NECK DOWN.  Do not use on open wounds or open sores. Avoid contact with your eyes, ears, mouth and genitals (private parts). Wash Face and genitals (private parts)  with your normal soap.   6. Wash thoroughly, paying special attention to the area where your surgery will be performed.  7. Thoroughly rinse your body with warm water from the neck down.  8. DO NOT shower/wash with your normal soap after using and rinsing off the CHG Soap.  9. Pat yourself dry with a CLEAN TOWEL.  10. Wear CLEAN PAJAMAS to bed the night before  surgery, wear comfortable clothes the morning of surgery  11. Place CLEAN SHEETS on your bed the night of your first shower and DO NOT SLEEP WITH PETS.    Day of Surgery:  Do not apply any deodorants/lotions.  Please wear clean clothes to the hospital/surgery center.   Remember to brush your teeth WITH YOUR REGULAR TOOTHPASTE.   Please read over the following fact sheets that you were given.

## 2018-09-12 ENCOUNTER — Encounter (HOSPITAL_COMMUNITY)
Admission: RE | Admit: 2018-09-12 | Discharge: 2018-09-12 | Disposition: A | Discharge status: Home / Self Care | Payer: BC Managed Care – PPO | Source: Ambulatory Visit | Attending: Orthopedic Surgery | Admitting: Orthopedic Surgery

## 2018-09-12 ENCOUNTER — Encounter (HOSPITAL_COMMUNITY): Payer: Self-pay

## 2018-09-12 ENCOUNTER — Other Ambulatory Visit: Payer: Self-pay

## 2018-09-12 ENCOUNTER — Other Ambulatory Visit (HOSPITAL_COMMUNITY)
Admission: RE | Admit: 2018-09-12 | Discharge: 2018-09-12 | Disposition: A | Discharge status: Home / Self Care | Payer: BC Managed Care – PPO | Source: Ambulatory Visit | Attending: Orthopedic Surgery | Admitting: Orthopedic Surgery

## 2018-09-12 DIAGNOSIS — M1712 Unilateral primary osteoarthritis, left knee: Secondary | ICD-10-CM | POA: Insufficient documentation

## 2018-09-12 DIAGNOSIS — Z01818 Encounter for other preprocedural examination: Secondary | ICD-10-CM | POA: Diagnosis not present

## 2018-09-12 DIAGNOSIS — F17200 Nicotine dependence, unspecified, uncomplicated: Secondary | ICD-10-CM | POA: Insufficient documentation

## 2018-09-12 DIAGNOSIS — Z1159 Encounter for screening for other viral diseases: Secondary | ICD-10-CM | POA: Insufficient documentation

## 2018-09-12 DIAGNOSIS — Z79899 Other long term (current) drug therapy: Secondary | ICD-10-CM | POA: Insufficient documentation

## 2018-09-12 DIAGNOSIS — I10 Essential (primary) hypertension: Secondary | ICD-10-CM | POA: Insufficient documentation

## 2018-09-12 HISTORY — DX: Other complications of anesthesia, initial encounter: T88.59XA

## 2018-09-12 HISTORY — DX: Headache, unspecified: R51.9

## 2018-09-12 HISTORY — DX: Other specified postprocedural states: Z98.890

## 2018-09-12 HISTORY — DX: Nausea with vomiting, unspecified: R11.2

## 2018-09-12 HISTORY — DX: Unspecified osteoarthritis, unspecified site: M19.90

## 2018-09-12 HISTORY — DX: Essential (primary) hypertension: I10

## 2018-09-12 LAB — URINALYSIS, ROUTINE W REFLEX MICROSCOPIC
Bilirubin Urine: NEGATIVE
Glucose, UA: NEGATIVE mg/dL
Ketones, ur: NEGATIVE mg/dL
Leukocytes,Ua: NEGATIVE
Nitrite: NEGATIVE
Protein, ur: NEGATIVE mg/dL
Specific Gravity, Urine: 1.023 (ref 1.005–1.030)
pH: 5 (ref 5.0–8.0)

## 2018-09-12 LAB — CBC
HCT: 41 % (ref 36.0–46.0)
Hemoglobin: 13.2 g/dL (ref 12.0–15.0)
MCH: 27.9 pg (ref 26.0–34.0)
MCHC: 32.2 g/dL (ref 30.0–36.0)
MCV: 86.7 fL (ref 80.0–100.0)
Platelets: 383 10*3/uL (ref 150–400)
RBC: 4.73 MIL/uL (ref 3.87–5.11)
RDW: 14 % (ref 11.5–15.5)
WBC: 10 10*3/uL (ref 4.0–10.5)
nRBC: 0 % (ref 0.0–0.2)

## 2018-09-12 LAB — BASIC METABOLIC PANEL
Anion gap: 11 (ref 5–15)
BUN: 7 mg/dL (ref 6–20)
CO2: 22 mmol/L (ref 22–32)
Calcium: 9.3 mg/dL (ref 8.9–10.3)
Chloride: 106 mmol/L (ref 98–111)
Creatinine, Ser: 0.7 mg/dL (ref 0.44–1.00)
GFR calc Af Amer: >60 mL/min (ref >60)
GFR calc non Af Amer: >60 mL/min (ref >60)
Glucose, Bld: 108 mg/dL — ABNORMAL HIGH (ref 70–99)
Potassium: 3.5 mmol/L (ref 3.5–5.1)
Sodium: 139 mmol/L (ref 135–145)

## 2018-09-12 LAB — SURGICAL PCR SCREEN
MRSA, PCR: NEGATIVE
Staphylococcus aureus: NEGATIVE

## 2018-09-12 LAB — SARS CORONAVIRUS 2 (TAT 6-24 HRS): SARS Coronavirus 2: NEGATIVE

## 2018-09-12 NOTE — Telephone Encounter (Signed)
Dr Marlou Sa spoke with provider [facility] ID#[id number] Surgery was authorized-auth# [id number]

## 2018-09-12 NOTE — Telephone Encounter (Signed)
Done thx

## 2018-09-12 NOTE — Progress Notes (Addendum)
PCP - No PCP per pt Cardiologist - Deepak D. Luiz Ochoa, MD  Chest x-ray - pt denies past year EKG - 2020 requested from [facility] D. Luiz Ochoa, MD,  Stress Test - pt denies ECHO - 2020 requested   Cardiac Cath - pt denies  Sleep Study - NO CPAP - n/a  Fasting Blood Sugar - n/a Checks Blood Sugar _____ times a day-n/a  Blood Thinner Instructions: n/a Aspirin Instructions:n/a  Anesthesia review: Yes, -requested heart studies-Pt reports everything was normal  Patient denies shortness of breath, fever, cough and chest pain at PAT appointment  Patient verbalized understanding of instructions that were given to them at the PAT appointment. Patient was also instructed that they will need to review over the PAT instructions again at home before surgery.

## 2018-09-13 LAB — URINE CULTURE: Culture: NO GROWTH

## 2018-09-15 ENCOUNTER — Other Ambulatory Visit: Payer: Self-pay

## 2018-09-15 NOTE — Telephone Encounter (Signed)
Noted in referral.

## 2018-09-15 NOTE — Anesthesia Preprocedure Evaluation (Addendum)
Anesthesia Evaluation  Patient identified by MRN, date of birth, ID band Patient awake    Reviewed: Allergy & Precautions, NPO status , Patient's Chart, lab work & pertinent test results  History of Anesthesia Complications (+) PONVNegative for: history of anesthetic complications  Airway Mallampati: II  TM Distance: >3 FB Neck ROM: Full    Dental   Pulmonary Current Smoker,    Pulmonary exam normal        Cardiovascular hypertension, Normal cardiovascular exam     Neuro/Psych  Headaches, negative psych ROS   GI/Hepatic negative GI ROS, Neg liver ROS,   Endo/Other  negative endocrine ROS  Renal/GU negative Renal ROS  negative genitourinary   Musculoskeletal  (+) Arthritis ,   Abdominal   Peds  Hematology negative hematology ROS (+)   Anesthesia Other Findings   Reproductive/Obstetrics                          Anesthesia Physical Anesthesia Plan  ASA: II  Anesthesia Plan: Spinal   Post-op Pain Management:  Regional for Post-op pain   Induction:   PONV Risk Score and Plan: 2 and Propofol infusion, Treatment may vary due to age or medical condition and Midazolam  Airway Management Planned: Natural Airway, Nasal Cannula and Simple Face Mask  Additional Equipment: None  Intra-op Plan:   Post-operative Plan:   Informed Consent: I have reviewed the patients History and Physical, chart, labs and discussed the procedure including the risks, benefits and alternatives for the proposed anesthesia with the patient or authorized representative who has indicated his/her understanding and acceptance.       Plan Discussed with:   Anesthesia Plan Comments: (PAT note written 09/15/2018 by Myra Gianotti, PA-C. )      Anesthesia Quick Evaluation

## 2018-09-15 NOTE — Progress Notes (Signed)
Anesthesia Chart Review:  Case: [id number] Date/Time: 09/16/18 1355   Procedure: LEFT TOTAL KNEE ARTHROPLASTY (Left )   Anesthesia type: Spinal   Pre-op diagnosis: left knee osteoarthritis   Location: [facility] OR ROOM 06 / [facility]   Surgeon: Meredith Pel, MD      DISCUSSION: Patient is a 43 year old female scheduled for the above procedure.  History includes smoking, post-operative N/V, HTN, migraines, nasal sinus surgery.  She was seen at [facility] on 08/19/18 by Levell July, NP. Plans for surgery discussed. He wrote, "From a cardiac standpoint the patient is considered a low to moderate risk for surgery under general anesthesia.  She denies any anginal or exertional symptoms, palpitations, or arrhythmias, orthostatic symptoms, stroke or TIA-like symptoms, dyspeptic symptoms, blood in stool or urine.  Claudication pain is coming from her knees and lower back..." She does have a history of palpitations, but not bothersome or lifestyle limiting.   09/12/18 presurgical COVID test negative. She is for a urine pregnancy test on arrival. If no EKG received from [facility] then she will need one on arrival as well.    VS: BP (P) 133/82   Pulse (P) 83   Temp (P) 36.5 C   Resp (P) 20   Ht (P) 5\' 11"  (1.803 m)   LMP 09/09/2018   SpO2 (P) 97%   BMI (P) 31.38 kg/m   PROVIDERS: Patient, No Pcp Per Laymond Purser, MD is cardiologist   LABS: Labs reviewed: Acceptable for surgery. (all labs ordered are listed, but only abnormal results are displayed)  Labs Reviewed  BASIC METABOLIC PANEL - Abnormal; Notable for the following components:      Result Value   Glucose, Bld 108 (*)    All other components within normal limits  URINALYSIS, ROUTINE W REFLEX MICROSCOPIC - Abnormal; Notable for the following components:   Hgb urine dipstick LARGE (*)    Bacteria, UA RARE (*)    All other components within normal limits  SURGICAL PCR SCREEN  URINE CULTURE  CBC    EKG:  ([facility]): Requested.    CV: Echo 11/21/17 ([facility]): Conclusions: 1. Normal biventricular dimensions and function. EF 65-70%.  2. No major valvular abnormalities noted. Trace MR. Trace TR. RVSP < 35 mmHg.  BLE venous US 11/27/17 ([facility]): Conclusions: 1. There is no evidence of deep vein thrombosis in the visualized lower extremity veins bilaterally. 2. Dilated but competent bilateral greater saphenous veins. Competent bilateral small saphenous veins.    Past Medical History:  Diagnosis Date  . Arthritis   . Complication of anesthesia    nausea and vomiting with C-section in 2012, also took a long time for spinal to wear off  . Headache    migraines  . Hypertension   . PONV (postoperative nausea and vomiting)     Past Surgical History:  Procedure Laterality Date  . CESAREAN SECTION    . CHOLECYSTECTOMY    . DIAGNOSTIC LAPAROSCOPY  2004   endometriosis  . KNEE ARTHROSCOPY Bilateral    2 on left, 2 on right  . NASAL SINUS SURGERY  2001  . TONSILLECTOMY      MEDICATIONS: . colchicine 0.6 MG tablet  . HYDROcodone-acetaminophen (NORCO/VICODIN) 5-325 MG tablet  . methylPREDNISolone (MEDROL DOSEPAK) 4 MG TBPK tablet  . nabumetone (RELAFEN) 750 MG tablet  . spironolactone (ALDACTONE) 25 MG tablet  . traMADol (ULTRAM) 50 MG tablet   No current facility-administered medications for this encounter.  She is not currently taking Medrol Dosepak or tramadol.   Shonna ChockAllison Zelenak, PA-C Surgical Short Stay/Anesthesiology [facility] Phone [phone] [facility] Phone [phone] 09/15/2018 12:55 PM

## 2018-09-16 ENCOUNTER — Inpatient Hospital Stay (HOSPITAL_COMMUNITY): Payer: BC Managed Care – PPO | Admitting: Vascular Surgery

## 2018-09-16 ENCOUNTER — Inpatient Hospital Stay (HOSPITAL_COMMUNITY)
Admission: RE | Admit: 2018-09-16 | Discharge: 2018-09-17 | DRG: 470 | Disposition: A | Discharge status: Home / Self Care | Payer: BC Managed Care – PPO | Attending: Orthopedic Surgery | Admitting: Orthopedic Surgery

## 2018-09-16 ENCOUNTER — Encounter (HOSPITAL_COMMUNITY)
Admission: RE | Disposition: A | Discharge status: Home / Self Care | Payer: Self-pay | Source: Home / Self Care | Attending: Orthopedic Surgery

## 2018-09-16 ENCOUNTER — Inpatient Hospital Stay (HOSPITAL_COMMUNITY): Payer: BC Managed Care – PPO | Admitting: Anesthesiology

## 2018-09-16 ENCOUNTER — Encounter (HOSPITAL_COMMUNITY): Payer: Self-pay | Admitting: Anesthesiology

## 2018-09-16 ENCOUNTER — Other Ambulatory Visit: Payer: Self-pay

## 2018-09-16 DIAGNOSIS — M1712 Unilateral primary osteoarthritis, left knee: Principal | ICD-10-CM | POA: Diagnosis present

## 2018-09-16 DIAGNOSIS — Z9049 Acquired absence of other specified parts of digestive tract: Secondary | ICD-10-CM

## 2018-09-16 DIAGNOSIS — Z79899 Other long term (current) drug therapy: Secondary | ICD-10-CM

## 2018-09-16 DIAGNOSIS — F17210 Nicotine dependence, cigarettes, uncomplicated: Secondary | ICD-10-CM | POA: Diagnosis present

## 2018-09-16 DIAGNOSIS — Z1159 Encounter for screening for other viral diseases: Secondary | ICD-10-CM

## 2018-09-16 DIAGNOSIS — Z6837 Body mass index (BMI) 37.0-37.9, adult: Secondary | ICD-10-CM

## 2018-09-16 DIAGNOSIS — I10 Essential (primary) hypertension: Secondary | ICD-10-CM | POA: Diagnosis present

## 2018-09-16 DIAGNOSIS — E669 Obesity, unspecified: Secondary | ICD-10-CM | POA: Diagnosis present

## 2018-09-16 DIAGNOSIS — M1710 Unilateral primary osteoarthritis, unspecified knee: Secondary | ICD-10-CM | POA: Diagnosis present

## 2018-09-16 HISTORY — PX: TOTAL KNEE ARTHROPLASTY: SHX125

## 2018-09-16 LAB — POCT PREGNANCY, URINE: Preg Test, Ur: NEGATIVE

## 2018-09-16 SURGERY — ARTHROPLASTY, KNEE, TOTAL
Anesthesia: Spinal | Laterality: Left

## 2018-09-16 MED ORDER — LACTATED RINGERS IV SOLN: INTRAVENOUS | Status: DC

## 2018-09-16 MED ORDER — MORPHINE SULFATE (PF) 4 MG/ML IV SOLN
INTRAVENOUS | Status: DC | PRN
  Administered 2018-09-16: 8 mg via INTRAVENOUS

## 2018-09-16 MED ORDER — TRANEXAMIC ACID 1000 MG/10ML IV SOLN
2000.0000 mg | INTRAVENOUS | Status: DC
  Filled 2018-09-16: qty 20

## 2018-09-16 MED ORDER — TRANEXAMIC ACID-NACL 1000-0.7 MG/100ML-% IV SOLN
1000.0000 mg | INTRAVENOUS | Status: AC
  Administered 2018-09-16: 1000 mg via INTRAVENOUS

## 2018-09-16 MED ORDER — ACETAMINOPHEN 325 MG PO TABS
325.0000 mg | ORAL_TABLET | Freq: Four times a day (QID) | ORAL | Status: DC | PRN
  Administered 2018-09-17: 650 mg via ORAL
  Filled 2018-09-16: qty 2

## 2018-09-16 MED ORDER — OXYCODONE HCL 5 MG/5ML PO SOLN: 5.0000 mg | Freq: Once | ORAL | Status: DC | PRN

## 2018-09-16 MED ORDER — HYDROMORPHONE HCL 1 MG/ML IJ SOLN
INTRAMUSCULAR | Status: AC
  Filled 2018-09-16: qty 0.5

## 2018-09-16 MED ORDER — TRANEXAMIC ACID-NACL 1000-0.7 MG/100ML-% IV SOLN
INTRAVENOUS | Status: AC
  Filled 2018-09-16: qty 100

## 2018-09-16 MED ORDER — TRANEXAMIC ACID 1000 MG/10ML IV SOLN
INTRAVENOUS | Status: DC | PRN
  Administered 2018-09-16: 12:00:00 2000 mg via TOPICAL

## 2018-09-16 MED ORDER — SPIRONOLACTONE 25 MG PO TABS
25.0000 mg | ORAL_TABLET | Freq: Every day | ORAL | Status: DC
  Administered 2018-09-17: 25 mg via ORAL
  Filled 2018-09-16: qty 1

## 2018-09-16 MED ORDER — OXYCODONE HCL 5 MG PO TABS: 5.0000 mg | ORAL_TABLET | Freq: Once | ORAL | Status: DC | PRN

## 2018-09-16 MED ORDER — DOCUSATE SODIUM 100 MG PO CAPS
100.0000 mg | ORAL_CAPSULE | Freq: Two times a day (BID) | ORAL | Status: DC
  Administered 2018-09-16 – 2018-09-17 (×2): 100 mg via ORAL
  Filled 2018-09-16 (×2): qty 1

## 2018-09-16 MED ORDER — METOCLOPRAMIDE HCL 5 MG/ML IJ SOLN: 5.0000 mg | Freq: Three times a day (TID) | INTRAMUSCULAR | Status: DC | PRN

## 2018-09-16 MED ORDER — ONDANSETRON HCL 4 MG/2ML IJ SOLN
INTRAMUSCULAR | Status: AC
  Filled 2018-09-16: qty 2

## 2018-09-16 MED ORDER — BUPIVACAINE LIPOSOME 1.3 % IJ SUSP
20.0000 mL | INTRAMUSCULAR | Status: DC
  Filled 2018-09-16: qty 20

## 2018-09-16 MED ORDER — HYDROMORPHONE HCL 1 MG/ML IJ SOLN: 0.5000 mg | INTRAMUSCULAR | Status: DC | PRN

## 2018-09-16 MED ORDER — LIDOCAINE 2% (20 MG/ML) 5 ML SYRINGE
INTRAMUSCULAR | Status: AC
  Filled 2018-09-16: qty 5

## 2018-09-16 MED ORDER — FENTANYL CITRATE (PF) 100 MCG/2ML IJ SOLN
INTRAMUSCULAR | Status: AC
  Administered 2018-09-16: 50 ug via INTRAVENOUS
  Filled 2018-09-16: qty 2

## 2018-09-16 MED ORDER — FENTANYL CITRATE (PF) 250 MCG/5ML IJ SOLN
INTRAMUSCULAR | Status: AC
  Filled 2018-09-16: qty 5

## 2018-09-16 MED ORDER — MIDAZOLAM HCL 2 MG/2ML IJ SOLN
2.0000 mg | Freq: Once | INTRAMUSCULAR | Status: AC
  Administered 2018-09-16: 2 mg via INTRAVENOUS

## 2018-09-16 MED ORDER — METHOCARBAMOL 500 MG PO TABS
500.0000 mg | ORAL_TABLET | Freq: Four times a day (QID) | ORAL | Status: DC | PRN
  Administered 2018-09-17 (×2): 500 mg via ORAL
  Filled 2018-09-16 (×2): qty 1

## 2018-09-16 MED ORDER — DEXAMETHASONE SODIUM PHOSPHATE 10 MG/ML IJ SOLN
INTRAMUSCULAR | Status: AC
  Filled 2018-09-16: qty 1

## 2018-09-16 MED ORDER — METHOCARBAMOL 1000 MG/10ML IJ SOLN
500.0000 mg | Freq: Four times a day (QID) | INTRAVENOUS | Status: DC | PRN
  Filled 2018-09-16: qty 5

## 2018-09-16 MED ORDER — LACTATED RINGERS IV SOLN: INTRAVENOUS | Status: AC

## 2018-09-16 MED ORDER — LACTATED RINGERS IV SOLN
INTRAVENOUS | Status: DC | PRN
  Administered 2018-09-16 (×2): via INTRAVENOUS

## 2018-09-16 MED ORDER — HYDROMORPHONE HCL 1 MG/ML IJ SOLN
INTRAMUSCULAR | Status: DC | PRN
  Administered 2018-09-16: 1 mg via INTRAVENOUS

## 2018-09-16 MED ORDER — PROPOFOL 10 MG/ML IV BOLUS
INTRAVENOUS | Status: DC | PRN
  Administered 2018-09-16: 200 mg via INTRAVENOUS

## 2018-09-16 MED ORDER — SODIUM CHLORIDE 0.9% FLUSH
INTRAVENOUS | Status: DC | PRN
  Administered 2018-09-16: 20 mL via INTRAVENOUS

## 2018-09-16 MED ORDER — BUPIVACAINE LIPOSOME 1.3 % IJ SUSP
INTRAMUSCULAR | Status: DC | PRN
  Administered 2018-09-16: 20 mL

## 2018-09-16 MED ORDER — OXYCODONE HCL 5 MG PO TABS
5.0000 mg | ORAL_TABLET | ORAL | Status: DC | PRN
  Administered 2018-09-16 – 2018-09-17 (×5): 10 mg via ORAL
  Filled 2018-09-16 (×5): qty 2

## 2018-09-16 MED ORDER — MIDAZOLAM HCL 2 MG/2ML IJ SOLN
INTRAMUSCULAR | Status: AC
  Filled 2018-09-16: qty 2

## 2018-09-16 MED ORDER — CLONIDINE HCL (ANALGESIA) 100 MCG/ML EP SOLN
EPIDURAL | Status: DC | PRN
  Administered 2018-09-16: 1 mL

## 2018-09-16 MED ORDER — BUPIVACAINE HCL 0.25 % IJ SOLN
INTRAMUSCULAR | Status: DC | PRN
  Administered 2018-09-16: 30 mL

## 2018-09-16 MED ORDER — FENTANYL CITRATE (PF) 100 MCG/2ML IJ SOLN: 25.0000 ug | INTRAMUSCULAR | Status: DC | PRN

## 2018-09-16 MED ORDER — METOCLOPRAMIDE HCL 5 MG PO TABS: 5.0000 mg | ORAL_TABLET | Freq: Three times a day (TID) | ORAL | Status: DC | PRN

## 2018-09-16 MED ORDER — 0.9 % SODIUM CHLORIDE (POUR BTL) OPTIME
TOPICAL | Status: DC | PRN
  Administered 2018-09-16 (×2): 1000 mL

## 2018-09-16 MED ORDER — MORPHINE SULFATE (PF) 4 MG/ML IV SOLN
INTRAVENOUS | Status: AC
  Filled 2018-09-16: qty 2

## 2018-09-16 MED ORDER — CELECOXIB 200 MG PO CAPS
200.0000 mg | ORAL_CAPSULE | Freq: Two times a day (BID) | ORAL | Status: DC
  Administered 2018-09-16 – 2018-09-17 (×2): 200 mg via ORAL
  Filled 2018-09-16 (×2): qty 1

## 2018-09-16 MED ORDER — CHLORHEXIDINE GLUCONATE 4 % EX LIQD: 60.0000 mL | Freq: Once | CUTANEOUS | Status: DC

## 2018-09-16 MED ORDER — ASPIRIN 81 MG PO CHEW
81.0000 mg | CHEWABLE_TABLET | Freq: Two times a day (BID) | ORAL | Status: DC
  Administered 2018-09-16 – 2018-09-17 (×2): 81 mg via ORAL
  Filled 2018-09-16 (×2): qty 1

## 2018-09-16 MED ORDER — ONDANSETRON HCL 4 MG/2ML IJ SOLN: 4.0000 mg | Freq: Once | INTRAMUSCULAR | Status: DC | PRN

## 2018-09-16 MED ORDER — CEFAZOLIN SODIUM-DEXTROSE 2-4 GM/100ML-% IV SOLN
2.0000 g | Freq: Four times a day (QID) | INTRAVENOUS | Status: AC
  Administered 2018-09-16 – 2018-09-17 (×2): 2 g via INTRAVENOUS
  Filled 2018-09-16 (×2): qty 100

## 2018-09-16 MED ORDER — ONDANSETRON HCL 4 MG/2ML IJ SOLN
INTRAMUSCULAR | Status: DC | PRN
  Administered 2018-09-16: 4 mg via INTRAVENOUS

## 2018-09-16 MED ORDER — FENTANYL CITRATE (PF) 100 MCG/2ML IJ SOLN
50.0000 ug | Freq: Once | INTRAMUSCULAR | Status: AC
  Administered 2018-09-16: 50 ug via INTRAVENOUS

## 2018-09-16 MED ORDER — MENTHOL 3 MG MT LOZG: 1.0000 | LOZENGE | OROMUCOSAL | Status: DC | PRN

## 2018-09-16 MED ORDER — PROPOFOL 500 MG/50ML IV EMUL
INTRAVENOUS | Status: DC | PRN
  Administered 2018-09-16: 30 ug/kg/min via INTRAVENOUS

## 2018-09-16 MED ORDER — GABAPENTIN 300 MG PO CAPS
300.0000 mg | ORAL_CAPSULE | Freq: Three times a day (TID) | ORAL | Status: DC
  Administered 2018-09-16 – 2018-09-17 (×3): 300 mg via ORAL
  Filled 2018-09-16 (×3): qty 1

## 2018-09-16 MED ORDER — DEXAMETHASONE SODIUM PHOSPHATE 10 MG/ML IJ SOLN
INTRAMUSCULAR | Status: DC | PRN
  Administered 2018-09-16: 5 mg via INTRAVENOUS

## 2018-09-16 MED ORDER — CEFAZOLIN SODIUM-DEXTROSE 2-4 GM/100ML-% IV SOLN: 2.0000 g | INTRAVENOUS | Status: DC

## 2018-09-16 MED ORDER — ONDANSETRON HCL 4 MG/2ML IJ SOLN: 4.0000 mg | Freq: Four times a day (QID) | INTRAMUSCULAR | Status: DC | PRN

## 2018-09-16 MED ORDER — PHENYLEPHRINE HCL-NACL 10-0.9 MG/250ML-% IV SOLN
INTRAVENOUS | Status: AC
  Filled 2018-09-16: qty 250

## 2018-09-16 MED ORDER — ROPIVACAINE HCL 5 MG/ML IJ SOLN
INTRAMUSCULAR | Status: DC | PRN
  Administered 2018-09-16: 30 mL via PERINEURAL

## 2018-09-16 MED ORDER — PHENOL 1.4 % MT LIQD: 1.0000 | OROMUCOSAL | Status: DC | PRN

## 2018-09-16 MED ORDER — FENTANYL CITRATE (PF) 100 MCG/2ML IJ SOLN
INTRAMUSCULAR | Status: DC | PRN
  Administered 2018-09-16 (×2): 50 ug via INTRAVENOUS

## 2018-09-16 MED ORDER — ONDANSETRON HCL 4 MG PO TABS: 4.0000 mg | ORAL_TABLET | Freq: Four times a day (QID) | ORAL | Status: DC | PRN

## 2018-09-16 MED ORDER — ONDANSETRON HCL 4 MG/2ML IJ SOLN
4.0000 mg | Freq: Once | INTRAMUSCULAR | Status: AC
  Administered 2018-09-16: 4 mg via INTRAVENOUS

## 2018-09-16 MED ORDER — BUPIVACAINE HCL (PF) 0.25 % IJ SOLN
INTRAMUSCULAR | Status: AC
  Filled 2018-09-16: qty 30

## 2018-09-16 MED ORDER — SODIUM CHLORIDE 0.9 % IR SOLN
Status: DC | PRN
  Administered 2018-09-16 (×2): 3000 mL

## 2018-09-16 MED ORDER — DEXTROSE 5 % IV SOLN
3.0000 g | INTRAVENOUS | Status: AC
  Administered 2018-09-16: 3 g via INTRAVENOUS
  Filled 2018-09-16: qty 3

## 2018-09-16 MED ORDER — MIDAZOLAM HCL 2 MG/2ML IJ SOLN
INTRAMUSCULAR | Status: AC
  Administered 2018-09-16: 2 mg via INTRAVENOUS
  Filled 2018-09-16: qty 2

## 2018-09-16 SURGICAL SUPPLY — 78 items
BAG DECANTER FOR FLEXI CONT (MISCELLANEOUS) ×3 IMPLANT
BANDAGE ACE 4X5 VEL STRL LF (GAUZE/BANDAGES/DRESSINGS) ×2 IMPLANT
BANDAGE ELASTIC 6 VELCRO ST LF (GAUZE/BANDAGES/DRESSINGS) ×2 IMPLANT
BANDAGE ESMARK 6X9 LF (GAUZE/BANDAGES/DRESSINGS) ×1 IMPLANT
BLADE SAG 18X100X1.27 (BLADE) ×3 IMPLANT
BLADE SAW SGTL 13.0X1.19X90.0M (BLADE) IMPLANT
BNDG COHESIVE 6X5 TAN STRL LF (GAUZE/BANDAGES/DRESSINGS) ×3 IMPLANT
BNDG ELASTIC 6X15 VLCR STRL LF (GAUZE/BANDAGES/DRESSINGS) ×3 IMPLANT
BNDG ESMARK 6X9 LF (GAUZE/BANDAGES/DRESSINGS) ×3
BOWL SMART MIX CTS (DISPOSABLE) IMPLANT
CLOSURE WOUND 1/2 X4 (GAUZE/BANDAGES/DRESSINGS) ×1
CONT SPECI 4OZ STER CLIK (MISCELLANEOUS) ×3 IMPLANT
COVER SURGICAL LIGHT HANDLE (MISCELLANEOUS) ×3 IMPLANT
COVER WAND RF STERILE (DRAPES) ×1 IMPLANT
CUFF TOURNIQUET SINGLE 34IN LL (TOURNIQUET CUFF) ×3 IMPLANT
CUFF TOURNIQUET SINGLE 44IN (TOURNIQUET CUFF) IMPLANT
DECANTER SPIKE VIAL GLASS SM (MISCELLANEOUS) ×3 IMPLANT
DRAPE INCISE IOBAN 66X45 STRL (DRAPES) IMPLANT
DRAPE ORTHO SPLIT 77X108 STRL (DRAPES) ×6
DRAPE SURG ORHT 6 SPLT 77X108 (DRAPES) ×3 IMPLANT
DRAPE U-SHAPE 47X51 STRL (DRAPES) ×3 IMPLANT
DURAPREP 26ML APPLICATOR (WOUND CARE) ×6 IMPLANT
ELECT CAUTERY BLADE 6.4 (BLADE) ×3 IMPLANT
ELECT REM PT RETURN 9FT ADLT (ELECTROSURGICAL) ×3
ELECTRODE REM PT RTRN 9FT ADLT (ELECTROSURGICAL) ×1 IMPLANT
FEMORAL RETAINING CRUC KNEE #4 (Knees) ×2 IMPLANT
GAUZE SPONGE 4X4 12PLY STRL (GAUZE/BANDAGES/DRESSINGS) ×3 IMPLANT
GLOVE BIOGEL PI IND STRL 7.5 (GLOVE) ×1 IMPLANT
GLOVE BIOGEL PI IND STRL 8 (GLOVE) ×1 IMPLANT
GLOVE BIOGEL PI INDICATOR 7.5 (GLOVE) ×2
GLOVE BIOGEL PI INDICATOR 8 (GLOVE) ×2
GLOVE ECLIPSE 7.0 STRL STRAW (GLOVE) ×3 IMPLANT
GLOVE SURG ORTHO 8.0 STRL STRW (GLOVE) ×3 IMPLANT
GOWN STRL REUS W/ TWL LRG LVL3 (GOWN DISPOSABLE) ×3 IMPLANT
GOWN STRL REUS W/TWL LRG LVL3 (GOWN DISPOSABLE) ×6
HANDPIECE INTERPULSE COAX TIP (DISPOSABLE) ×2
HOOD PEEL AWAY FLYTE STAYCOOL (MISCELLANEOUS) ×9 IMPLANT
IMMOBILIZER KNEE 20 (SOFTGOODS) IMPLANT
IMMOBILIZER KNEE 22 UNIV (SOFTGOODS) ×2 IMPLANT
IMMOBILIZER KNEE 24 THIGH 36 (MISCELLANEOUS) IMPLANT
IMMOBILIZER KNEE 24 UNIV (MISCELLANEOUS)
INSERT TIB 5 9XCNDRL STAB BRNG (Insert) IMPLANT
JET LAVAGE IRRISEPT WOUND (IRRIGATION / IRRIGATOR) ×3
KIT BASIN OR (CUSTOM PROCEDURE TRAY) ×3 IMPLANT
KIT TURNOVER KIT B (KITS) ×3 IMPLANT
KNEE INSERT TIB BRG (Insert) ×2 IMPLANT
KNEE PATELLA ASYMMETRIC 10X32 (Knees) ×2 IMPLANT
KNEE TIBIAL COMPONENT SZ5 (Knees) ×2 IMPLANT
LAVAGE JET IRRISEPT WOUND (IRRIGATION / IRRIGATOR) IMPLANT
MANIFOLD NEPTUNE II (INSTRUMENTS) ×3 IMPLANT
NDL SAFETY ECLIPSE 18X1.5 (NEEDLE) ×1 IMPLANT
NEEDLE 22X1 1/2 (OR ONLY) (NEEDLE) ×6 IMPLANT
NEEDLE HYPO 18GX1.5 SHARP (NEEDLE) ×2
NS IRRIG 1000ML POUR BTL (IV SOLUTION) ×6 IMPLANT
PACK TOTAL JOINT (CUSTOM PROCEDURE TRAY) ×3 IMPLANT
PAD ABD 8X10 STRL (GAUZE/BANDAGES/DRESSINGS) ×2 IMPLANT
PAD ARMBOARD 7.5X6 YLW CONV (MISCELLANEOUS) ×6 IMPLANT
PAD CAST 4YDX4 CTTN HI CHSV (CAST SUPPLIES) ×1 IMPLANT
PADDING CAST COTTON 4X4 STRL (CAST SUPPLIES) ×2
PADDING CAST COTTON 6X4 STRL (CAST SUPPLIES) ×3 IMPLANT
PIN FLUTED HEDLESS FIX 3.5X1/8 (Miscellaneous) ×2 IMPLANT
SET HNDPC FAN SPRY TIP SCT (DISPOSABLE) ×1 IMPLANT
STRIP CLOSURE SKIN 1/2X4 (GAUZE/BANDAGES/DRESSINGS) ×3 IMPLANT
SUCTION FRAZIER HANDLE 10FR (MISCELLANEOUS) ×2
SUCTION TUBE FRAZIER 10FR DISP (MISCELLANEOUS) ×1 IMPLANT
SUT MNCRL AB 3-0 PS2 18 (SUTURE) ×3 IMPLANT
SUT VIC AB 0 CT1 27 (SUTURE) ×6
SUT VIC AB 0 CT1 27XBRD ANBCTR (SUTURE) ×3 IMPLANT
SUT VIC AB 1 CT1 27 (SUTURE) ×10
SUT VIC AB 1 CT1 27XBRD ANBCTR (SUTURE) ×5 IMPLANT
SUT VIC AB 2-0 CT1 27 (SUTURE) ×8
SUT VIC AB 2-0 CT1 TAPERPNT 27 (SUTURE) ×4 IMPLANT
SYR 30ML LL (SYRINGE) ×9 IMPLANT
SYR TB 1ML LUER SLIP (SYRINGE) ×3 IMPLANT
TOWEL GREEN STERILE FF (TOWEL DISPOSABLE) ×6 IMPLANT
TOWEL OR 17X26 10 PK STRL BLUE (TOWEL DISPOSABLE) ×6 IMPLANT
TRAY CATH 16FR W/PLASTIC CATH (SET/KITS/TRAYS/PACK) IMPLANT
WATER STERILE IRR 1000ML POUR (IV SOLUTION) IMPLANT

## 2018-09-16 NOTE — Progress Notes (Signed)
No pt yet Pain ok Mobilize am

## 2018-09-16 NOTE — Anesthesia Procedure Notes (Addendum)
Procedure Name: LMA Insertion Date/Time: 09/16/2018 11:33 AM Performed by: Jearld Pies, CRNA Pre-anesthesia Checklist: Patient identified, Emergency Drugs available, Suction available and Patient being monitored Patient Re-evaluated:Patient Re-evaluated prior to induction Oxygen Delivery Method: Circle System Utilized Preoxygenation: Pre-oxygenation with 100% oxygen Induction Type: IV induction Ventilation: Mask ventilation without difficulty LMA: LMA inserted LMA Size: 5.0 Number of attempts: 1 Airway Equipment and Method: Bite block Placement Confirmation: positive ETCO2 Tube secured with: Tape Dental Injury: Teeth and Oropharynx as per pre-operative assessment

## 2018-09-16 NOTE — H&P (Signed)
TOTAL KNEE ADMISSION H&P  Patient is being admitted for left total knee arthroplasty.  Subjective:  Chief Complaint:left knee pain.  HPI: Lauren Hicks, 43 y.o. female, has a history of pain and functional disability in the left knee due to arthritis and has failed non-surgical conservative treatments for greater than 12 weeks to includeNSAID's and/or analgesics, corticosteriod injections and activity modification.  Onset of symptoms was gradual, starting >10 years ago with gradually worsening course since that time. The patient noted prior procedures on the knee to include  arthroscopy on the left knee(s).  Patient currently rates pain in the left knee(s) at 10 out of 10 with activity. Patient has night pain, worsening of pain with activity and weight bearing, pain that interferes with activities of daily living, pain with passive range of motion, crepitus and joint swelling.  Patient has evidence of subchondral sclerosis, periarticular osteophytes and joint space narrowing by imaging studies. This patient has had Debilitating pain nonresponsive to all nonoperative measures. There is no active infection.  Patient Active Problem List   Diagnosis Date Noted  . Palpitations 07/14/2018  . History of endometriosis 11/01/2017  . Abnormal maternal glucose tolerance, antepartum 07/26/2004  . Supervision of normal first pregnancy 07/26/2004   Past Medical History:  Diagnosis Date  . Arthritis   . Complication of anesthesia    nausea and vomiting with C-section in 2012, also took a long time for spinal to wear off  . Headache    migraines  . Hypertension   . PONV (postoperative nausea and vomiting)     Past Surgical History:  Procedure Laterality Date  . CESAREAN SECTION    . CHOLECYSTECTOMY    . DIAGNOSTIC LAPAROSCOPY  2004   endometriosis  . KNEE ARTHROSCOPY Bilateral    2 on left, 2 on right  . NASAL SINUS SURGERY  2001  . TONSILLECTOMY      Current Facility-Administered  Medications  Medication Dose Route Frequency Provider Last Rate Last Dose  . bupivacaine liposome (EXPAREL) 1.3 % injection 266 mg  20 mL Other To OR Marlou Sa Tonna Corner, MD      . ceFAZolin (ANCEF) 3 g in dextrose 5 % 50 mL IVPB  3 g Intravenous To SS-Surg Meredith Pel, MD      . chlorhexidine (HIBICLENS) 4 % liquid 4 application  60 mL Topical Once Meredith Pel, MD      . chlorhexidine (HIBICLENS) 4 % liquid 4 application  60 mL Topical Once Meredith Pel, MD      . lactated ringers infusion   Intravenous Continuous Lidia Collum, MD      . ondansetron [facility] (2-Rh)) 4 MG/2ML injection           . tranexamic acid (CYKLOKAPRON) 2,000 mg in sodium chloride 0.9 % 50 mL Topical Application  4,132 mg Topical To OR Marlou Sa, Tonna Corner, MD       Facility-Administered Medications Ordered in Other Encounters  Medication Dose Route Frequency Provider Last Rate Last Dose  . ropivacaine (PF) 5 mg/mL (0.5%) (NAROPIN) injection   Peri-NEURAL Anesthesia Intra-op Lidia Collum, MD   30 mL at 09/16/18 0943   No Known Allergies  Social History   Tobacco Use  . Smoking status: Current Every Day Smoker    Packs/day: 0.50    Types: Cigarettes  . Smokeless tobacco: Never Used  Substance Use Topics  . Alcohol use: No    Family History  Problem Relation Age of Onset  . Diabetes  Mother   . Broken bones Mother   . Osteoarthritis Father   . Pancreatitis Father      Review of Systems  Musculoskeletal: Positive for joint pain.  All other systems reviewed and are negative.   Objective:  Physical Exam  Constitutional: She appears well-developed.  HENT:  Head: Normocephalic.  Eyes: Pupils are equal, round, and reactive to light.  Neck: Normal range of motion.  Cardiovascular: Normal rate.  Respiratory: Effort normal.  Neurological: She is alert.  Skin: Skin is warm.  Psychiatric: She has a normal mood and affect.  Ortho exam demonstrates palpable pedal pulses with intact  dorsiflexion.  Skin is intact in the left knee region.  Range of motion is excellent but the patella tracks significantly laterally.  Collateral and cruciate ligaments are stable.  Vital signs in last 24 hours: Temp:  [97.7 F (36.5 C)] 97.7 F (36.5 C) (06/23 0843) Pulse Rate:  [65-83] 67 (06/23 1000) Resp:  [14-20] 15 (06/23 1000) BP: (138-164)/(75-97) 138/75 (06/23 1000) SpO2:  [92 %-100 %] 92 % (06/23 1000) Weight:  [122.5 kg] 122.5 kg (06/23 0843)  Labs:   Estimated body mass index is 37.66 kg/m as calculated from the following:   Height as of this encounter: 5\' 11"  (1.803 m).   Weight as of this encounter: 122.5 kg.   Imaging Review Plain radiographs demonstrate severe degenerative joint disease of the left knee(s). The overall alignment isneutral. The bone quality appears to be good for age and reported activity level.      Assessment/Plan:  End stage arthritis, left knee   The patient history, physical examination, clinical judgment of the provider and imaging studies are consistent with end stage degenerative joint disease of the left knee(s) and total knee arthroplasty is deemed medically necessary. The treatment options including medical management, injection therapy arthroscopy and arthroplasty were discussed at length. The risks and benefits of total knee arthroplasty were presented and reviewed. The risks due to aseptic loosening, infection, stiffness, patella tracking problems, thromboembolic complications and other imponderables were discussed. The patient acknowledged the explanation, agreed to proceed with the plan and consent was signed. Patient is being admitted for inpatient treatment for surgery, pain control, PT, OT, prophylactic antibiotics, VTE prophylaxis, progressive ambulation and ADL's and discharge planning. The patient is planning to be discharged home with home health services Patient does have end-stage primarily patellofemoral arthritis with  maltracking.  Patella is thin and the plan is for total knee replacement with lateralization of the femoral component with some potentially increased external rotation in order to compensate for tracking.  Anticipate being able to use press-fit components on the femur and tibia and hopefully on the patella.  Risk benefits are discussed including but not limited to infection nerve vessel damage incomplete pain relief as well as potential for revision in her lifetime.  All questions answered   Anticipated LOS equal to or greater than 2 midnights due to - Age 42 and older with one or more of the following:  - Obesity  - Expected need for hospital services (PT, OT, Nursing) required for safe  discharge  - Anticipated need for postoperative skilled nursing care or inpatient rehab  - Active co-morbidities: None OR   - Unanticipated findings during/Post Surgery: None  - Patient is a high risk of re-admission due to: None

## 2018-09-16 NOTE — Op Note (Signed)
NAMESONORA, Lauren Hicks MEDICAL RECORD IO:[id number] ACCOUNT [id number] DATE OF BIRTH:Dec 14, 1975 FACILITY: [facility] LOCATION: [facility] PHYSICIAN:GREGORY Randel Pigg, MD  OPERATIVE REPORT  DATE OF PROCEDURE:  09/16/2018  PREOPERATIVE DIAGNOSIS:  Left knee severe osteoarthritis.  POSTOPERATIVE DIAGNOSIS:  Left knee severe osteoarthritis.  PROCEDURE:  Left total knee replacement using St[employer]ker press-fit components, size 3 femur, 4 tibia, 9 mm deep dish polyethylene insert and 32 mm press fit 3-peg patella.  All components press fit and posterior cruciate retaining.  SURGEON:  Meredith Pel, MD  ASSISTANT:  Laure Kidney, RNFA and ____, PA.  INDICATIONS:  The patient is a 43 year old patient with end-stage left knee arthritis who presents for operative management after explanation of risks and benefits.  She has had severe patellar maltracking for 3 decades and has unrelenting pain  refractory to all nonoperative measures.  PROCEDURE IN DETAIL:  The patient was brought to the operating room where general endotracheal anesthesia was induced.  Preoperative antibiotics administered.  Timeout was called.  The left knee was pre-scrubbed with alcohol and Betadine, allowed to air  dry.  Prep with DuraPrep solution and draped in a sterile manner.  Ioban used to cover the entire operative field.  Timeout was called.  Left leg elevated exsanguinated with the Esmarch wrap, tourniquet inflated to 300 mmHg.  Anterior approach to the  knee was made.  Skin and subcutaneous tissue were sharply divided.  Median parapatellar approach was made and marked with #1 Vicryl suture.  Patella was everted.  There was significant and severe arthritis extending about halfway down through the  trochlea on both sides of both the medial and lateral femoral condyle.  The patella itself was eburnated and had significant spurring as was evidenced on preoperative MRI scanning.  At this time, the lateral patellofemoral ligament  was released and soft  tissue removed from the anterior distal femur.  Fat pad partially excised and medial soft tissue dissection was performed to allow for retractor placement.  Intramedullary alignment was then utilized to make a cut off the tibia.  The cut was measured 9  mm off the convex tibial plateau.  This cut was made and the bone quality was good.  Attention was then directed towards the femur.  Intramedullary alignment was then used to make the cut in 5 degrees valgus cutting 8 mm off the distal femoral surface.   At this time, a 9 mm spacer was placed and the patient was found to have full extension.  The femur was then sized to a size 3 and the 4-in-1 cutting block was utilized.  At this time, the tibia was keel punched.  The final preparations for the press-fit  tibia were then performed.  The patella was then evaluated.  The patient had a sloped patella.  Using an oscillating saw, the patella was cut down to approximately 10-11 mm.  Relatively uniform thickness with a flat surface.  Three peg patella was then  placed, which restored the patellar height to approximately 20-22 mm.  Trial components in place, the patient had good varus and valgus stability at 0 and 30 degrees.  Patella tracked excellent in an excellent fashion using no thumbs technique.  At this  time, trial components were removed and the true press-fit were placed with excellent press-fit obtained.  Same stability and tracking parameters were maintained.  Thorough irrigation was performed.  It should be noted that a solution of Marcaine,  Exparel and saline were injected into the capsule throughout the procedure.  Irrisept  was utilized to prevent infection.  This was an irrigating solution.  Tourniquet was released and bleeding points encountered, controlled with electrocautery.  The  incision was then closed over a bolster using #1 Vicryl suture, 0 Vicryl suture, 2-0 Vicryl suture and a 3-0 Monocryl.  Aquacel dressing and  knee immobilizer and a bulky dressing placed.  The patient tolerated the procedure well without immediate  complications.  He was transferred to the recovery room in stable condition.  The assistance of both RNFA and the PA were required at all times for the case for retraction.  Their assistance was a medical necessity.  TN/NUANCE  D:09/16/2018 T:09/16/2018 JOB:[id number]

## 2018-09-16 NOTE — Brief Op Note (Signed)
   09/16/2018  2:33 PM  PATIENT:  Lauren Hicks  43 y.o. female  PRE-OPERATIVE DIAGNOSIS:  left knee osteoarthritis  POST-OPERATIVE DIAGNOSIS:  left knee osteoarthritis  PROCEDURE:  Procedure(s): LEFT TOTAL KNEE ARTHROPLASTY  SURGEON:  Surgeon(s): Meredith Pel, MD  ASSISTANT: Modena Slater rnfa  ANESTHESIA:   gen  EBL: 75 ml    Total I/O In: 1000 [I.V.:1000] Out: 100 [Blood:100]  BLOOD ADMINISTERED: none  DRAINS: none   LOCAL MEDICATIONS USED: Marcaine morphine clonidine Exparel  SPECIMEN:  No Specimen  COUNTS:  YES  TOURNIQUET:   Total Tourniquet Time Documented: Thigh (Left) - 100 minutes Total: Thigh (Left) - 100 minutes   DICTATION: .PLAN OF CARE: Admit for overnight observation [id number] PATIENT DISPOSITION:  PACU - hemodynamically stable

## 2018-09-16 NOTE — Progress Notes (Signed)
PT Cancellation Note  Patient Details Name: Lauren Hicks MRN: [id number] DOB: 01/03/1976   Cancelled Treatment:    Reason Eval/Treat Not Completed: Fatigue/lethargy limiting ability to participate Pt's RN reporting pt very sleepy and requesting to hold. Will follow up as schedule allows.   Leighton Ruff, PT, DPT  Acute Rehabilitation Services  Pager: [phone] Office: [phone]    Rudean Hitt 09/16/2018, 5:36 PM

## 2018-09-16 NOTE — Anesthesia Procedure Notes (Addendum)
Anesthesia Regional Block: Adductor canal block   Pre-Anesthetic Checklist: ,, timeout performed, Correct Patient, Correct Site, Correct Laterality, Correct Procedure, Correct Position, site marked, Risks and benefits discussed,  Surgical consent,  Pre-op evaluation,  At surgeon's request and post-op pain management  Laterality: Left  Prep: chloraprep       Needles:  Injection technique: Single-shot  Needle Type: Echogenic Stimulator Needle     Needle Length: 9cm  Needle Gauge: 21     Additional Needles:   Procedures:,,,, ultrasound used (permanent image in chart),,,,  Narrative:  Start time: 09/16/2018 9:40 AM End time: 09/16/2018 9:43 AM Injection made incrementally with aspirations every 5 mL.  Performed by: Personally  Anesthesiologist: Lidia Collum, MD  Additional Notes: Monitors applied. Injection made in 5cc increments. No resistance to injection. Good needle visualization. Patient tolerated procedure well.

## 2018-09-16 NOTE — Transfer of Care (Signed)
Immediate Anesthesia Transfer of Care Note  Patient: Lauren Hicks  Procedure(s) Performed: LEFT TOTAL KNEE ARTHROPLASTY (Left )  Patient Location: PACU  Anesthesia Type:GA combined with regional for post-op pain  Level of Consciousness: drowsy and patient cooperative  Airway & Oxygen Therapy: Patient Spontanous Breathing and Patient connected to face mask oxygen  Post-op Assessment: Report given to RN and Post -op Vital signs reviewed and stable  Post vital signs: Reviewed and stable  Last Vitals:  Vitals Value Taken Time  BP 160/106 09/16/18 1446  Temp    Pulse 87 09/16/18 1450  Resp 16 09/16/18 1450  SpO2 98 % 09/16/18 1450  Vitals shown include unvalidated device data.  Last Pain:  Vitals:   09/16/18 0918  TempSrc:   PainSc: 4          Complications: No apparent anesthesia complications

## 2018-09-16 NOTE — Progress Notes (Signed)
Orthopedic Tech Progress Note Patient Details:  Lauren Hicks 02-Jan-1976 [id number]  CPM Left Knee CPM Left Knee: On Left Knee Flexion (Degrees): 40 Left Knee Extension (Degrees): 10 Additional Comments: foot roll  Post Interventions Patient Tolerated: Well Instructions Provided: Care of device  [name] 09/16/2018, 4:55 PM

## 2018-09-17 ENCOUNTER — Telehealth: Payer: Self-pay | Admitting: Orthopedic Surgery

## 2018-09-17 ENCOUNTER — Telehealth: Payer: Self-pay

## 2018-09-17 ENCOUNTER — Encounter (HOSPITAL_COMMUNITY): Payer: Self-pay | Admitting: General Practice

## 2018-09-17 DIAGNOSIS — M1712 Unilateral primary osteoarthritis, left knee: Secondary | ICD-10-CM | POA: Diagnosis present

## 2018-09-17 DIAGNOSIS — I10 Essential (primary) hypertension: Secondary | ICD-10-CM | POA: Diagnosis present

## 2018-09-17 DIAGNOSIS — Z6837 Body mass index (BMI) 37.0-37.9, adult: Secondary | ICD-10-CM | POA: Diagnosis not present

## 2018-09-17 DIAGNOSIS — Z1159 Encounter for screening for other viral diseases: Secondary | ICD-10-CM | POA: Diagnosis not present

## 2018-09-17 DIAGNOSIS — E669 Obesity, unspecified: Secondary | ICD-10-CM | POA: Diagnosis present

## 2018-09-17 DIAGNOSIS — Z9049 Acquired absence of other specified parts of digestive tract: Secondary | ICD-10-CM | POA: Diagnosis not present

## 2018-09-17 DIAGNOSIS — Z79899 Other long term (current) drug therapy: Secondary | ICD-10-CM | POA: Diagnosis not present

## 2018-09-17 DIAGNOSIS — F17210 Nicotine dependence, cigarettes, uncomplicated: Secondary | ICD-10-CM | POA: Diagnosis present

## 2018-09-17 MED ORDER — ASPIRIN 81 MG PO CHEW: 81.0000 mg | CHEWABLE_TABLET | Freq: Two times a day (BID) | ORAL | 0 refills | Status: DC

## 2018-09-17 MED ORDER — GABAPENTIN 300 MG PO CAPS: 300.0000 mg | ORAL_CAPSULE | Freq: Three times a day (TID) | ORAL | 0 refills | Status: AC

## 2018-09-17 MED ORDER — OXYCODONE HCL 5 MG PO TABS: 5.0000 mg | ORAL_TABLET | ORAL | 0 refills | Status: DC | PRN

## 2018-09-17 MED ORDER — CELECOXIB 200 MG PO CAPS: 200.0000 mg | ORAL_CAPSULE | Freq: Two times a day (BID) | ORAL | 0 refills | Status: AC

## 2018-09-17 MED ORDER — METHOCARBAMOL 500 MG PO TABS: 500.0000 mg | ORAL_TABLET | Freq: Four times a day (QID) | ORAL | 0 refills | Status: DC | PRN

## 2018-09-17 NOTE — Evaluation (Signed)
Physical Therapy Evaluation Patient Details Name: Lauren Hicks MRN: [id number] DOB: 26-Nov-1975 Today's Date: 09/17/2018   History of Present Illness  43 yo female with onset of OA in L knee was admitted for TKA, now referred to PT to get home ASAP.  Pt has previously received arthroscopic surgery on both knees and anticipates a TKA on R knee when this has recovered.  PMHx:  OA, c-sections, PONV, palpitations, endometriosis, Migraines, HTN  Clinical Impression  Pt was seen for initial eval of mobility and ROM to L knee, after which brace was applied to walk.  Pt is able to get to BR with minor help and manage with brace to stand using wall bar supervised.  Her plan upon sitting in chair was to apply ice and review ROM to knee but is still awaiting meds.  Will review the ex's in afternoon session, and progress gait as tolerated.  Acute therapy for ROM and strengthening L knee, to instruct safety and balance with gait and to work on control of LLE in brace in transitions.     Follow Up Recommendations Home health PT;Supervision for mobility/OOB    Equipment Recommendations  Rolling walker with 5" wheels    Recommendations for Other Services       Precautions / Restrictions Precautions Precautions: Knee Precaution Booklet Issued: Yes (comment) Required Braces or Orthoses: Knee Immobilizer - Left Knee Immobilizer - Left: On when out of bed or walking Restrictions Weight Bearing Restrictions: Yes Other Position/Activity Restrictions: WBAT L knee      Mobility  Bed Mobility Overal bed mobility: Needs Assistance Bed Mobility: Supine to Sit     Supine to sit: Supervision     General bed mobility comments: pt used bed rail and HOB slightly elevated  Transfers Overall transfer level: Needs assistance Equipment used: Rolling walker (2 wheeled);1 person hand held assist Transfers: Sit to/from Stand Sit to Stand: Min assist         General transfer comment: pt is doing well  with walker with a little height on the bed, min assist to power up and with wall rails is min guard  Ambulation/Gait Ambulation/Gait assistance: Min guard Gait Distance (Feet): 40 Feet Assistive device: Rolling walker (2 wheeled);1 person hand held assist Gait Pattern/deviations: Step-through pattern;Decreased stride length;Wide base of support;Decreased weight shift to left Gait velocity: reduced Gait velocity interpretation: <1.31 ft/sec, indicative of household ambulator General Gait Details: using immob to steady LLE for gait and to steady initial standing, but is requiring extra time to maneuver for sitting  Stairs            Wheelchair Mobility    Modified Rankin (Stroke Patients Only)       Balance Overall balance assessment: Needs assistance Sitting-balance support: Feet supported Sitting balance-Leahy Scale: Good     Standing balance support: Bilateral upper extremity supported;During functional activity Standing balance-Leahy Scale: Fair Standing balance comment: needs RW in dynamic standing                             Pertinent Vitals/Pain Pain Assessment: 0-10 Pain Score: 9  Pain Location: L knee awaiting meds Pain Descriptors / Indicators: Operative site guarding;Grimacing Pain Intervention(s): Limited activity within patient's tolerance;Monitored during session;Repositioned;Patient requesting pain meds-RN notified;Ice applied    Home Living Family/patient expects to be discharged to:: Private residence Living Arrangements: Children;Parent Available Help at Discharge: Family;Available 24 hours/day Type of Home: House Home Access: Stairs to enter Entrance  Stairs-Rails: Can reach both Entrance Stairs-Number of Steps: 1 Home Layout: One level Home Equipment: Cane - single point;Bedside commode;Shower seat;Crutches Additional Comments: has been equipped for this by previous arthroscopic surgeries.    Prior Function Level of Independence:  Independent with assistive device(s)         Comments: crutches recently     Hand Dominance   Dominant Hand: Right    Extremity/Trunk Assessment   Upper Extremity Assessment Upper Extremity Assessment: Overall WFL for tasks assessed    Lower Extremity Assessment Lower Extremity Assessment: LLE deficits/detail LLE Deficits / Details: -30 ext and 50 flexion L knee LLE Coordination: decreased fine motor;decreased gross motor    Cervical / Trunk Assessment Cervical / Trunk Assessment: Normal  Communication   Communication: No difficulties  Cognition Arousal/Alertness: Awake/alert Behavior During Therapy: WFL for tasks assessed/performed Overall Cognitive Status: Within Functional Limits for tasks assessed                                        General Comments General comments (skin integrity, edema, etc.): Pt is in significant pain from previous to therapy arrival, and was able to walk and move without meds.  Nursing notified pt still waiting, and arrived finally to deliver meds.  Ice to L knee after walk to decrease pain esp on L thigh    Exercises     Assessment/Plan    PT Assessment Patient needs continued PT services  PT Problem List Decreased strength;Decreased range of motion;Decreased activity tolerance;Decreased balance;Decreased coordination;Decreased mobility;Decreased knowledge of use of DME;Obesity;Decreased skin integrity;Pain       PT Treatment Interventions DME instruction;Gait training;Stair training;Functional mobility training;Therapeutic activities;Therapeutic exercise;Balance training;Neuromuscular re-education;Patient/family education    PT Goals (Current goals can be found in the Care Plan section)  Acute Rehab PT Goals Patient Stated Goal: to go home and get pain managed PT Goal Formulation: With patient Time For Goal Achievement: 10/01/18 Potential to Achieve Goals: Good    Frequency 7X/week   Barriers to discharge  Inaccessible home environment has one step to enter house    Co-evaluation               AM-PAC PT "6 Clicks" Mobility  Outcome Measure Help needed turning from your back to your side while in a flat bed without using bedrails?: None Help needed moving from lying on your back to sitting on the side of a flat bed without using bedrails?: A Little Help needed moving to and from a bed to a chair (including a wheelchair)?: A Little Help needed standing up from a chair using your arms (e.g., wheelchair or bedside chair)?: A Little Help needed to walk in hospital room?: A Little Help needed climbing 3-5 steps with a railing? : A Lot 6 Click Score: 18    End of Session Equipment Utilized During Treatment: Gait belt Activity Tolerance: Patient tolerated treatment well;Patient limited by fatigue;Patient limited by pain Patient left: in chair;with call bell/phone within reach Nurse Communication: Mobility status;Patient requests pain meds PT Visit Diagnosis: Unsteadiness on feet (R26.81);Muscle weakness (generalized) (M62.81);Hemiplegia and hemiparesis    Time: [id number] PT Time Calculation (min) (ACUTE ONLY): 54 min   Charges:   PT Evaluation $PT Eval Moderate Complexity: 1 Mod PT Treatments $Gait Training: 8-22 mins $Therapeutic Exercise: 8-22 mins $Therapeutic Activity: 8-22 mins       Ivar DrapeRuth E Shahidah Nesbitt 09/17/2018, 1:25 PM  Samul Dadauth Vylette Strubel, PT  MS Acute Rehab Dept. Number: [place] and [place]

## 2018-09-17 NOTE — Telephone Encounter (Signed)
Utilization review called about this pt. She had surgery yesterday and states they nee dan order to change her from observation to in patient. Please call with questions.

## 2018-09-17 NOTE — Telephone Encounter (Signed)
Please see below.

## 2018-09-17 NOTE — Anesthesia Postprocedure Evaluation (Signed)
Anesthesia Post Note  Patient: Lauren Hicks  Procedure(s) Performed: LEFT TOTAL KNEE ARTHROPLASTY (Left )     Patient location during evaluation: PACU Anesthesia Type: Spinal Level of consciousness: awake and alert Pain management: pain level controlled Vital Signs Assessment: post-procedure vital signs reviewed and stable Respiratory status: spontaneous breathing, nonlabored ventilation and respiratory function stable Cardiovascular status: blood pressure returned to baseline and stable Postop Assessment: no apparent nausea or vomiting Anesthetic complications: no    Last Vitals:  Vitals:   09/17/18 0843 09/17/18 1150  BP: 135/90 (!) 142/90  Pulse: 86 88  Resp: 16 18  Temp: 36.4 C (!) 36.3 C  SpO2: 99% 100%    Last Pain:  Vitals:   09/17/18 1219  TempSrc:   PainSc: 6     LLE Motor Response: Purposeful movement (09/17/18 1235)            Lidia Collum

## 2018-09-17 NOTE — Progress Notes (Signed)
The patient has been checked frequently and has been updated frequently regarding a discharge order for her.

## 2018-09-17 NOTE — Progress Notes (Signed)
Dr Marlou Sa notified of the patient's desire to be discharged this evening.  He was made aware of no discharge orders and unable to print AVS.  The patient was made aware of the update and discharge will be completed when order placed.  The patient will plan on discharge to her home.  Her mother will be picking her up at the hospital.  The patient will be discharged via wheel chair.  The patient has been medicated for pain.

## 2018-09-17 NOTE — Telephone Encounter (Signed)
Thank you. Noted.

## 2018-09-17 NOTE — Progress Notes (Signed)
Patient stable Patient's pain is reasonably well controlled. She is tolerating CPM machine. Plan for discharge today.

## 2018-09-17 NOTE — Progress Notes (Signed)
  Subjective: Pt is a 43 y.o. F s/p L TKA on 09/16/2018.  She is POD1.  She states her pain is tolerable. She denies any numbness/tingling/weakness.  Her nausea has improved from pre-op.     Objective: Vital signs in last 24 hours: Temp:  [97.2 F (36.2 C)-98.6 F (37 C)] 98.5 F (36.9 C) (06/24 0400) Pulse Rate:  [65-89] 87 (06/24 0400) Resp:  [8-20] 14 (06/24 0400) BP: (130-164)/(75-106) 130/87 (06/24 0400) SpO2:  [92 %-100 %] 97 % (06/24 0400) Weight:  [122.5 kg] 122.5 kg (06/23 0843)  Intake/Output from previous day: 06/23 0701 - 06/24 0700 In: 4496 [P.O.:440; I.V.:1150; IV Piggyback:200] Out: 400 [Urine:300; Blood:100] Intake/Output this shift: No intake/output data recorded.  Exam:  Intact pulses distally Dorsiflexion/Plantar flexion intact  Labs: No results for input(s): HGB in the last 72 hours. No results for input(s): WBC, RBC, HCT, PLT in the last 72 hours. No results for input(s): NA, K, CL, CO2, BUN, CREATININE, GLUCOSE, CALCIUM in the last 72 hours. No results for input(s): LABPT, INR in the last 72 hours.  Assessment/Plan: D/c home today or tomorrow pending patient's pain and PT clearance  Dressing removed today.  Aquacel bandage will remain on until followup in clinic.  Pt understands plan and agrees.     Tanaisha Pittman L Sundiata Ferrick 09/17/2018, 8:35 AM

## 2018-09-17 NOTE — Telephone Encounter (Signed)
FYI:    Triage call: Lauren Hicks called again about needing change of status from observation to inpatient. I advised her this was ok per Dr. Randel Pigg earlier phone message response.

## 2018-09-17 NOTE — Telephone Encounter (Signed)
Please advise 

## 2018-09-17 NOTE — Progress Notes (Signed)
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  Pt has previously received arthroscopic surgery on both knees and anticipates a TKA on R knee when this has recovered.  PMHx:  OA, c-sections, PONV, palpitations, endometriosis, Migraines, HTN    PT Comments    Pt was seen for mobility to walk and practice stairs, and will be ready for home with family when MD is prepared for DC.  Pt is expecting to go home, and was up taking a short walk on RW when PT arrived to see her this afternoon.  Follow up with acute PT as pt's stay dictates, with further training for exercises, gait training and balance with RW to prepare further for home if this is the surgeon's plan.     Follow Up Recommendations  Supervision for mobility/OOB;Follow surgeon's recommendation for DC plan and follow-up therapies     Equipment Recommendations  Rolling walker with 5" wheels    Recommendations for Other Services       Precautions / Restrictions Precautions Precautions: Knee Precaution Booklet Issued: Yes (comment) Required Braces or Orthoses: Knee Immobilizer - Left Knee Immobilizer - Left: On when out of bed or walking Restrictions Weight Bearing Restrictions: Yes Other Position/Activity Restrictions: WBAT L knee    Mobility  Bed Mobility Overal bed mobility: Needs Assistance             General bed mobility comments: pt elected to sit on side of bed  Transfers Overall transfer level: Needs assistance Equipment used: Rolling walker (2 wheeled);1 person hand held assist Transfers: Sit to/from Stand Sit to Stand: Min guard         General transfer comment: pt is doing well with walker with a little height on the bed, min assist to power up and with wall rails is min  guard  Ambulation/Gait Ambulation/Gait assistance: Min guard Gait Distance (Feet): 45 Feet Assistive device: Rolling walker (2 wheeled);1 person hand held assist Gait Pattern/deviations: Step-through pattern;Decreased stride length;Wide base of support;Decreased weight shift to left Gait velocity: reduced   General Gait Details: using immob to steady LLE for gait and to steady initial standing, but is requiring extra time to maneuver for sitting   Stairs Stairs: Yes Stairs assistance: Min guard Stair Management: No rails;Forwards;Backwards;Step to pattern Number of Stairs: 2 General stair comments: able to maneuver with walker   Wheelchair Mobility    Modified Rankin (Stroke Patients Only)       Balance Overall balance assessment: Needs assistance Sitting-balance support: Feet supported Sitting balance-Leahy Scale: Good     Standing balance support: Bilateral upper extremity supported;During functional activity Standing balance-Leahy Scale: Fair Standing balance comment: needs RW in dynamic standing                            Cognition Arousal/Alertness: Awake/alert Behavior During Therapy: WFL for tasks assessed/performed Overall Cognitive Status: Within Functional Limits for tasks assessed                                        Exercises      General Comments General comments (skin integrity, edema, etc.): pt has less pain after receiving  meds      Pertinent Vitals/Pain Pain Assessment: 0-10 Pain Score: 6  Pain Location: L knee awaiting meds Pain Descriptors / Indicators: Operative site guarding;Grimacing Pain Intervention(s): Limited activity within patient's tolerance;Monitored during session;Premedicated before session;Repositioned    Home Living                      Prior Function            PT Goals (current goals can now be found in the care plan section) Acute Rehab PT Goals Patient Stated Goal: to go home  and get pain managed PT Goal Formulation: With patient Time For Goal Achievement: 10/01/18 Potential to Achieve Goals: Good    Frequency    7X/week      PT Plan      Co-evaluation              AM-PAC PT "6 Clicks" Mobility   Outcome Measure  Help needed turning from your back to your side while in a flat bed without using bedrails?: None Help needed moving from lying on your back to sitting on the side of a flat bed without using bedrails?: A Little Help needed moving to and from a bed to a chair (including a wheelchair)?: A Little Help needed standing up from a chair using your arms (e.g., wheelchair or bedside chair)?: A Little Help needed to walk in hospital room?: A Little Help needed climbing 3-5 steps with a railing? : A Little 6 Click Score: 19    End of Session Equipment Utilized During Treatment: Gait belt Activity Tolerance: Patient tolerated treatment well;Patient limited by fatigue;Patient limited by pain Patient left: with call bell/phone within reach;in bed Nurse Communication: Mobility status PT Visit Diagnosis: Unsteadiness on feet (R26.81);Muscle weakness (generalized) (M62.81);Hemiplegia and hemiparesis     Time: 5573-2202 PT Time Calculation (min) (ACUTE ONLY): 32 min  Charges:  $Gait Training: 8-22 mins $Therapeutic Activity: 8-22 mins                   Ramond Dial 09/17/2018, 5:20 PM  Mee Hives, PT MS Acute Rehab Dept. Number: [place] [facility] [place]

## 2018-09-17 NOTE — Telephone Encounter (Signed)
Done thx

## 2018-09-17 NOTE — Telephone Encounter (Signed)
Patient waiting to be discharged and wants a call to let her know when she will be. She was told today. Please call   [phone]

## 2018-09-17 NOTE — Progress Notes (Signed)
The Primary nurse has spent time with the patient regarding pain medication regimen and discharge disposition.  At this time, there is not a discharge order from Dr Marlou Sa.

## 2018-09-18 ENCOUNTER — Encounter (HOSPITAL_COMMUNITY): Payer: Self-pay | Admitting: Orthopedic Surgery

## 2018-09-18 NOTE — Addendum Note (Signed)
Addendum  created 09/18/18 0820 by Lidia Collum, MD   Clinical Note Signed, Intraprocedure Blocks edited

## 2018-09-19 NOTE — Discharge Summary (Signed)
Physician Discharge Summary      Patient ID: Lauren DustHeather L Leavy MRN: [id number] DOB/AGE: 42/10/1975 42 y.o.  Admit date: 09/16/2018 Discharge date: 09/17/2018  Admission Diagnoses:  Active Problems:   Arthritis of knee   Discharge Diagnoses:  Same  Surgeries: Procedure(s): LEFT TOTAL KNEE ARTHROPLASTY on 09/16/2018   Consultants:   Discharged Condition: Stable  Hospital Course: Lauren Hicks is an 43 y.o. female who was admitted 09/16/2018 with a chief complaint of left knee pain, and found to have a diagnosis of left knee arthritis.  They were brought to the operating room on 09/16/2018 and underwent the above named procedures.  Patient did well with surgery.  She was mobilizing on the day of surgery with therapy.  Pain was controlled and she 1.  She is discharged home in good condition with aspirin for DVT prophylaxis pain medicine and muscle relaxers as well as her preoperative medications.  Follow-up with me in approximately 10 days.  Antibiotics given:  Anti-infectives (From admission, onward)   Start     Dose/Rate Route Frequency Ordered Stop   09/16/18 1800  ceFAZolin (ANCEF) IVPB 2g/100 mL premix     2 g 200 mL/hr over 30 Minutes Intravenous Every 6 hours 09/16/18 1727 09/17/18 0134   09/16/18 0930  ceFAZolin (ANCEF) 3 g in dextrose 5 % 50 mL IVPB     3 g 160 mL/hr over 30 Minutes Intravenous To ShortStay Surgical 09/16/18 0920 09/16/18 1205   09/16/18 0900  ceFAZolin (ANCEF) IVPB 2g/100 mL premix  Status:  Discontinued     2 g 200 mL/hr over 30 Minutes Intravenous On call to O.R. 09/16/18 [id number] 09/16/18 [id number]    .  Recent vital signs:  Vitals:   09/17/18 0843 09/17/18 1150  BP: 135/90 (!) 142/90  Pulse: 86 88  Resp: 16 18  Temp: 97.6 F (36.4 C) (!) 97.4 F (36.3 C)  SpO2: 99% 100%    Recent laboratory studies:  Results for orders placed or performed during the hospital encounter of 09/16/18  Pregnancy, urine POC  Result Value Ref Range   Preg Test, Ur  NEGATIVE NEGATIVE    Discharge Medications:   Allergies as of 09/17/2018      Reactions   Fentanyl    ' It makes me so sick ,everytime "      Medication List    STOP taking these medications   HYDROcodone-acetaminophen 5-325 MG tablet Commonly known as: NORCO/VICODIN   methylPREDNISolone 4 MG Tbpk tablet Commonly known as: MEDROL DOSEPAK   nabumetone 750 MG tablet Commonly known as: RELAFEN   traMADol 50 MG tablet Commonly known as: ULTRAM     TAKE these medications   aspirin 81 MG chewable tablet Chew 1 tablet (81 mg total) by mouth 2 (two) times daily.   celecoxib 200 MG capsule Commonly known as: CELEBREX Take 1 capsule (200 mg total) by mouth 2 (two) times daily.   colchicine 0.6 MG tablet Take 1 tablet (0.6 mg total) by mouth 2 (two) times daily as needed.   gabapentin 300 MG capsule Commonly known as: NEURONTIN Take 1 capsule (300 mg total) by mouth 3 (three) times daily.   methocarbamol 500 MG tablet Commonly known as: ROBAXIN Take 1 tablet (500 mg total) by mouth every 6 (six) hours as needed for muscle spasms.   oxyCODONE 5 MG immediate release tablet Commonly known as: Oxy IR/ROXICODONE Take 1-2 tablets (5-10 mg total) by mouth every 4 (four) hours as needed for moderate pain (pain  score 4-6).   spironolactone 25 MG tablet Commonly known as: ALDACTONE Take 25 mg by mouth daily.       Diagnostic Studies: No results found.  Disposition:   Discharge Instructions    Call MD / Call 911   Complete by: As directed    If you experience chest pain or shortness of breath, CALL 911 and be transported to the hospital emergency room.  If you develope a fever above 101 F, pus (white drainage) or increased drainage or redness at the wound, or calf pain, call your surgeon's office.   Constipation Prevention   Complete by: As directed    Drink plenty of fluids.  Prune juice may be helpful.  You may use a stool softener, such as Colace (over the counter) 100  mg twice a day.  Use MiraLax (over the counter) for constipation as needed.   Diet - low sodium heart healthy   Complete by: As directed    Discharge instructions   Complete by: As directed    Okay to be weightbearing as tolerated on the left leg Okay to shower dressing is waterproof Use the blue foam boot to facilitate straightening of the leg CPM machine 1 hour minimum 3 times a day   Increase activity slowly as tolerated   Complete by: As directed          Signed: Anderson Malta 09/19/2018, 7:33 AM

## 2018-09-21 ENCOUNTER — Encounter: Payer: Self-pay | Admitting: Orthopedic Surgery

## 2018-09-22 NOTE — Telephone Encounter (Signed)
CPM 1 hour 3 times a day.  Can you please make sure she has home health physical therapy also to work on leg strengthening thank you

## 2018-09-25 ENCOUNTER — Other Ambulatory Visit: Payer: Self-pay | Admitting: Orthopedic Surgery

## 2018-09-25 MED ORDER — OXYCODONE HCL 5 MG PO TABS: 5.0000 mg | ORAL_TABLET | ORAL | 0 refills | Status: AC | PRN

## 2018-09-30 ENCOUNTER — Encounter: Payer: Self-pay | Admitting: Orthopedic Surgery

## 2018-09-30 ENCOUNTER — Telehealth: Payer: Self-pay | Admitting: *Deleted

## 2018-09-30 NOTE — Telephone Encounter (Signed)
Okay for no home health PT as long as she has been in the CPM machine and using the blue foam Cradle boot for extension.  Straight leg raising on her own would also be good.  I will see her back in clinic and then start outpatient therapy

## 2018-09-30 NOTE — Telephone Encounter (Signed)
Patient has not been able to get HHPT. Please advise. She has an upcoming appt for post op. Do you just want to eval her then since she has been using CPM and potentially refer to outpatient PT?

## 2018-09-30 NOTE — Telephone Encounter (Signed)
Received call from [facility] with [facility] stating they have to cancel pt due can't staff due to insurance reasons. I was advised they will be contactiing pt to inform her of this.

## 2018-10-01 ENCOUNTER — Ambulatory Visit: Payer: Self-pay

## 2018-10-01 ENCOUNTER — Ambulatory Visit (HOSPITAL_COMMUNITY)
Admission: RE | Admit: 2018-10-01 | Discharge: 2018-10-01 | Disposition: A | Discharge status: Home / Self Care | Payer: BC Managed Care – PPO | Source: Ambulatory Visit | Attending: Orthopedic Surgery | Admitting: Orthopedic Surgery

## 2018-10-01 ENCOUNTER — Encounter: Payer: Self-pay | Admitting: Orthopedic Surgery

## 2018-10-01 ENCOUNTER — Other Ambulatory Visit: Payer: Self-pay

## 2018-10-01 ENCOUNTER — Ambulatory Visit (INDEPENDENT_AMBULATORY_CARE_PROVIDER_SITE_OTHER): Payer: BC Managed Care – PPO | Admitting: Orthopedic Surgery

## 2018-10-01 ENCOUNTER — Other Ambulatory Visit: Payer: Self-pay | Admitting: Orthopedic Surgery

## 2018-10-01 DIAGNOSIS — Z96652 Presence of left artificial knee joint: Secondary | ICD-10-CM | POA: Diagnosis present

## 2018-10-01 DIAGNOSIS — M25572 Pain in left ankle and joints of left foot: Secondary | ICD-10-CM | POA: Diagnosis not present

## 2018-10-01 MED ORDER — RIVAROXABAN (XARELTO) VTE STARTER PACK (15 & 20 MG): ORAL_TABLET | ORAL | 0 refills | Status: AC

## 2018-10-01 MED ORDER — OXYCODONE HCL 5 MG PO TABS: 5.0000 mg | ORAL_TABLET | Freq: Four times a day (QID) | ORAL | 0 refills | Status: DC | PRN

## 2018-10-01 MED ORDER — METHOCARBAMOL 500 MG PO TABS: 500.0000 mg | ORAL_TABLET | Freq: Three times a day (TID) | ORAL | 0 refills | Status: DC | PRN

## 2018-10-01 NOTE — Progress Notes (Signed)
Left lower extremity venous duplex has been completed. Preliminary results can be found in CV Proc through chart review.  Results were given to Dr. Marlou Sa.  10/01/18 3:58 PM Lauren Hicks RVT

## 2018-10-01 NOTE — Progress Notes (Signed)
I called patient.  No blood clot.  Go down to 1 baby aspirin a day.  Come back in 2 weeks.

## 2018-10-02 ENCOUNTER — Encounter: Payer: Self-pay | Admitting: Orthopedic Surgery

## 2018-10-02 NOTE — Progress Notes (Signed)
Post-Op Visit Note   Patient: Lauren Hicks           Date of Birth: 01/05/1976           MRN: [id number] Visit Date: 10/01/2018 PCP: Patient, No Pcp Per   Assessment & Plan:  Chief Complaint: No chief complaint on file.  Visit Diagnoses:  1. S/P total knee arthroplasty, left     Plan: Lauren Hicks is a patient who is now 2 weeks out left total knee replacement for patellofemoral arthritis.  Been having a lot of calf pain.  Also having a lot of foot and ankle pain and is having difficulty getting around.  CPM is only at 40 degrees.  Her insurance did not cover home health physical therapy.  On exam patient has calf tenderness with positive Homans sign.  She has pretty limited flexion but her extensor mechanism is palpable and intact.  Mild effusion.  No significant skin color change or difference side to side or significant temperature difference other than expected postop warmth in the left knee compared to the right.  Plan at this time is to start physical therapy in [place].  Refill oxycodone and Robaxin.  She is also having some ankle pain.  Ankle feels symmetric and radiographs are normal.  She has palpable intact nontender anterior to posterior to peroneal and Achilles tendons.  At the time of this dictation ultrasound was obtained and negative for blood clot.  I will take her back to 1 aspirin a day.  She will not need Xarelto.  Come back in 2 weeks just to check on range of motion.  She may require manipulation.  I do not think this looks like reflux sympathetic dystrophy.  Thus another thing to keep in mind.  Follow-Up Instructions: Return in about 2 weeks (around 10/15/2018).   Orders:  Orders Placed This Encounter  Procedures  . XR Knee 1-2 Views Left  . XR Ankle Complete Left  . XR Foot Complete Left   Meds ordered this encounter  Medications  . oxyCODONE (ROXICODONE) 5 MG immediate release tablet    Sig: Take 1 tablet (5 mg total) by mouth every 6 (six) hours as needed for  severe pain.    Dispense:  35 tablet    Refill:  0  . Rivaroxaban 15 & 20 MG TBPK    Sig: Take as directed on package: Start with one 15mg  tablet by mouth twice a day with food. On Day 22, switch to one 20mg  tablet once a day with food.    Dispense:  51 each    Refill:  0  . methocarbamol (ROBAXIN) 500 MG tablet    Sig: Take 1 tablet (500 mg total) by mouth every 8 (eight) hours as needed for muscle spasms.    Dispense:  30 tablet    Refill:  0    Imaging: No results found.  PMFS History: Patient Active Problem List   Diagnosis Date Noted  . Arthritis of knee 09/16/2018  . Palpitations 07/14/2018  . History of endometriosis 11/01/2017  . Abnormal maternal glucose tolerance, antepartum 07/26/2004  . Supervision of normal first pregnancy 07/26/2004   Past Medical History:  Diagnosis Date  . Arthritis   . Complication of anesthesia    nausea and vomiting with C-section in 2012, also took a long time for spinal to wear off  . Headache    migraines  . Hypertension   . PONV (postoperative nausea and vomiting)     Family  History  Problem Relation Age of Onset  . Diabetes Mother   . Broken bones Mother   . Osteoarthritis Father   . Pancreatitis Father     Past Surgical History:  Procedure Laterality Date  . CESAREAN SECTION    . CHOLECYSTECTOMY    . DIAGNOSTIC LAPAROSCOPY  2004   endometriosis  . KNEE ARTHROSCOPY Bilateral    2 on left, 2 on right  . NASAL SINUS SURGERY  2001  . TONSILLECTOMY    . TOTAL KNEE ARTHROPLASTY Left 09/16/2018  . TOTAL KNEE ARTHROPLASTY Left 09/16/2018   Procedure: LEFT TOTAL KNEE ARTHROPLASTY;  Surgeon: Meredith Pel, MD;  Location: [facility];  Service: Orthopedics;  Laterality: Left;   Social History   Occupational History  . Not on file  Tobacco Use  . Smoking status: Current Every Day Smoker    Packs/day: 0.50    Types: Cigarettes  . Smokeless tobacco: Never Used  Substance and Sexual Activity  . Alcohol use: No  . Drug use:  No  . Sexual activity: Not Currently

## 2018-10-04 ENCOUNTER — Encounter: Payer: Self-pay | Admitting: Orthopedic Surgery

## 2018-10-06 ENCOUNTER — Other Ambulatory Visit: Payer: Self-pay | Admitting: Surgical

## 2018-10-06 MED ORDER — OXYCODONE HCL 5 MG PO TABS: 5.0000 mg | ORAL_TABLET | ORAL | 0 refills | Status: AC | PRN

## 2018-10-06 NOTE — Telephone Encounter (Signed)
Okay to get incision wet.  The oxycodone should be refilled because the first prescription was only for 7 days.  Okay to refill oxycodone 5 mg 1 p.o. every 3 to 4 hours as needed pain #35.

## 2018-10-15 ENCOUNTER — Other Ambulatory Visit: Payer: Self-pay

## 2018-10-15 ENCOUNTER — Encounter: Payer: Self-pay | Admitting: Orthopedic Surgery

## 2018-10-15 ENCOUNTER — Ambulatory Visit (INDEPENDENT_AMBULATORY_CARE_PROVIDER_SITE_OTHER): Payer: BC Managed Care – PPO | Admitting: Orthopedic Surgery

## 2018-10-15 DIAGNOSIS — Z96652 Presence of left artificial knee joint: Secondary | ICD-10-CM

## 2018-10-15 NOTE — Progress Notes (Signed)
   Post-Op Visit Note   Patient: Lauren Hicks           Date of Birth: 1975-07-19           MRN: [id number] Visit Date: 10/15/2018 PCP: Patient, No Pcp Per   Assessment & Plan:  Chief Complaint:  Chief Complaint  Patient presents with  . Left Knee - Routine Post Op   Visit Diagnoses:  1. Status post total left knee replacement     Plan: Lauren Hicks is now about a month out left total knee replacement for patellofemoral arthritis.  She is having a tough time last clinic visit but now she is doing little better.  Still having trouble achieving motion due to pain.  On exam she does have decreased patella mobility but does have full extension.  Only about 50 degrees of flexion but she has gained about 10 degrees with therapy this week.  I would not refill her oxycodone and see her back in 2 weeks.  She needs to be making continued progress in the at least at 75 or 80 degrees or we may need to consider manipulation.  No calf tenderness today.  Ultrasound negative for DVT last clinic visit  Follow-Up Instructions: Return in about 2 weeks (around 10/29/2018).   Orders:  No orders of the defined types were placed in this encounter.  No orders of the defined types were placed in this encounter.   Imaging: No results found.  PMFS History: Patient Active Problem List   Diagnosis Date Noted  . Arthritis of knee 09/16/2018  . Palpitations 07/14/2018  . History of endometriosis 11/01/2017  . Abnormal maternal glucose tolerance, antepartum 07/26/2004  . Supervision of normal first pregnancy 07/26/2004   Past Medical History:  Diagnosis Date  . Arthritis   . Complication of anesthesia    nausea and vomiting with C-section in 2012, also took a long time for spinal to wear off  . Headache    migraines  . Hypertension   . PONV (postoperative nausea and vomiting)     Family History  Problem Relation Age of Onset  . Diabetes Mother   . Broken bones Mother   . Osteoarthritis Father    . Pancreatitis Father     Past Surgical History:  Procedure Laterality Date  . CESAREAN SECTION    . CHOLECYSTECTOMY    . DIAGNOSTIC LAPAROSCOPY  2004   endometriosis  . KNEE ARTHROSCOPY Bilateral    2 on left, 2 on right  . NASAL SINUS SURGERY  2001  . TONSILLECTOMY    . TOTAL KNEE ARTHROPLASTY Left 09/16/2018  . TOTAL KNEE ARTHROPLASTY Left 09/16/2018   Procedure: LEFT TOTAL KNEE ARTHROPLASTY;  Surgeon: Meredith Pel, MD;  Location: [facility];  Service: Orthopedics;  Laterality: Left;   Social History   Occupational History  . Not on file  Tobacco Use  . Smoking status: Current Every Day Smoker    Packs/day: 0.50    Types: Cigarettes  . Smokeless tobacco: Never Used  Substance and Sexual Activity  . Alcohol use: No  . Drug use: No  . Sexual activity: Not Currently

## 2018-10-16 NOTE — Telephone Encounter (Signed)
I would not put anything on the incision yet until she is about 6 weeks out

## 2018-10-25 ENCOUNTER — Other Ambulatory Visit: Payer: Self-pay | Admitting: Orthopedic Surgery

## 2018-10-27 NOTE — Telephone Encounter (Signed)
Ok to rf? 

## 2018-10-27 NOTE — Telephone Encounter (Signed)
No need to do this if she has been taking aspirin for 3 weeks postop.  Thanks

## 2018-10-29 ENCOUNTER — Encounter: Payer: Self-pay | Admitting: Orthopedic Surgery

## 2018-10-29 ENCOUNTER — Other Ambulatory Visit: Payer: Self-pay

## 2018-10-29 ENCOUNTER — Ambulatory Visit (INDEPENDENT_AMBULATORY_CARE_PROVIDER_SITE_OTHER): Payer: BC Managed Care – PPO | Admitting: Orthopedic Surgery

## 2018-10-29 DIAGNOSIS — Z96652 Presence of left artificial knee joint: Secondary | ICD-10-CM

## 2018-10-29 NOTE — Progress Notes (Signed)
   Post-Op Visit Note   Patient: Lauren Hicks           Date of Birth: 05/19/1975           MRN: [id number] Visit Date: 10/29/2018 PCP: Patient, No Pcp Per   Assessment & Plan:  Chief Complaint:  Chief Complaint  Patient presents with  . Left Knee - Routine Post Op   Visit Diagnoses:  1. Status post total left knee replacement     Plan: Lauren Hicks is a patient with left total knee replacement who is now 6 weeks out.  She is had a bit of a rocky postop course.  Decision point today was for or against manipulation under anesthesia to achieve more flexion.  In general she states she is making progress with therapy although it is slow.  On exam she has about 60 degrees of flexion today.  Appropriate amount of leg swelling.  Prior ultrasound negative for DVT.  She did get back to work.  I think I would favor manipulation at this time but she wants to hold off on that intervention.  We will see how she is in 2 weeks and then if she is not easily past 70 degrees then we will need to consider manipulation under anesthesia for range of motion.  Come back in 2 weeks for the clinical recheck.  Follow-Up Instructions: Return in about 2 weeks (around 11/12/2018).   Orders:  No orders of the defined types were placed in this encounter.  No orders of the defined types were placed in this encounter.   Imaging: No results found.  PMFS History: Patient Active Problem List   Diagnosis Date Noted  . Arthritis of knee 09/16/2018  . Palpitations 07/14/2018  . History of endometriosis 11/01/2017  . Abnormal maternal glucose tolerance, antepartum 07/26/2004  . Supervision of normal first pregnancy 07/26/2004   Past Medical History:  Diagnosis Date  . Arthritis   . Complication of anesthesia    nausea and vomiting with C-section in 2012, also took a long time for spinal to wear off  . Headache    migraines  . Hypertension   . PONV (postoperative nausea and vomiting)     Family History   Problem Relation Age of Onset  . Diabetes Mother   . Broken bones Mother   . Osteoarthritis Father   . Pancreatitis Father     Past Surgical History:  Procedure Laterality Date  . CESAREAN SECTION    . CHOLECYSTECTOMY    . DIAGNOSTIC LAPAROSCOPY  2004   endometriosis  . KNEE ARTHROSCOPY Bilateral    2 on left, 2 on right  . NASAL SINUS SURGERY  2001  . TONSILLECTOMY    . TOTAL KNEE ARTHROPLASTY Left 09/16/2018  . TOTAL KNEE ARTHROPLASTY Left 09/16/2018   Procedure: LEFT TOTAL KNEE ARTHROPLASTY;  Surgeon: Meredith Pel, MD;  Location: [facility];  Service: Orthopedics;  Laterality: Left;   Social History   Occupational History  . Not on file  Tobacco Use  . Smoking status: Current Every Day Smoker    Packs/day: 0.50    Types: Cigarettes  . Smokeless tobacco: Never Used  Substance and Sexual Activity  . Alcohol use: No  . Drug use: No  . Sexual activity: Not Currently

## 2018-11-04 ENCOUNTER — Telehealth: Payer: Self-pay

## 2018-11-04 NOTE — Telephone Encounter (Signed)
no

## 2018-11-04 NOTE — Telephone Encounter (Signed)
Does patient need refill on aspirin? Request sent from her pharmacy.

## 2018-11-05 NOTE — Telephone Encounter (Signed)
Called pharmacy advised not necessary to fill at this time.

## 2018-11-12 ENCOUNTER — Ambulatory Visit (INDEPENDENT_AMBULATORY_CARE_PROVIDER_SITE_OTHER): Payer: BC Managed Care – PPO | Admitting: Orthopedic Surgery

## 2018-11-12 ENCOUNTER — Encounter: Payer: Self-pay | Admitting: Orthopedic Surgery

## 2018-11-12 ENCOUNTER — Other Ambulatory Visit: Payer: Self-pay

## 2018-11-12 DIAGNOSIS — Z96652 Presence of left artificial knee joint: Secondary | ICD-10-CM

## 2018-11-12 MED ORDER — METHOCARBAMOL 500 MG PO TABS: 500.0000 mg | ORAL_TABLET | Freq: Three times a day (TID) | ORAL | 0 refills | Status: AC | PRN

## 2018-11-12 MED ORDER — TRAMADOL HCL 50 MG PO TABS: 50.0000 mg | ORAL_TABLET | Freq: Two times a day (BID) | ORAL | 0 refills | Status: AC | PRN

## 2018-11-12 NOTE — Progress Notes (Signed)
   Post-Op Visit Note   Patient: Lauren Hicks           Date of Birth: 03/05/1976           MRN: [id number] Visit Date: 11/12/2018 PCP: Patient, No Pcp Per   Assessment & Plan:  Chief Complaint:  Chief Complaint  Patient presents with  . Follow-up   Visit Diagnoses: No diagnosis found.  Plan: Pt is a 43 y.o. Female s/p Left TKA on 09/16/2018.  Pt's motion has improved to 0 degrees of extension with 90 degrees of flexion in the past two weeks.  She has been doing PT 3 times per week.  She is ambulating with a cane and has completely discontinued her walker. She is taking Robaxin and Oxycodone occasionally, about once every other day.  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 Incision is healing well. Strongly encouraged pt to continue her current therapy and home exercise routine in order to maximize her flexion and function.  Pt will return in one month for final clinical check; no need for manipulation at this time.    Follow-Up Instructions: No follow-ups on file.   Orders:  No orders of the defined types were placed in this encounter.  No orders of the defined types were placed in this encounter.   Imaging: No results found.  PMFS History: Patient Active Problem List   Diagnosis Date Noted  . Arthritis of knee 09/16/2018  . Palpitations 07/14/2018  . History of endometriosis 11/01/2017  . Abnormal maternal glucose tolerance, antepartum 07/26/2004  . Supervision of normal first pregnancy 07/26/2004   Past Medical History:  Diagnosis Date  . Arthritis   . Complication of anesthesia    nausea and vomiting with C-section in 2012, also took a long time for spinal to wear off  . Headache    migraines  . Hypertension   . PONV (postoperative nausea and vomiting)     Family History  Problem Relation Age of Onset  . Diabetes Mother   . Broken bones Mother   . Osteoarthritis  Father   . Pancreatitis Father     Past Surgical History:  Procedure Laterality Date  . CESAREAN SECTION    . CHOLECYSTECTOMY    . DIAGNOSTIC LAPAROSCOPY  2004   endometriosis  . KNEE ARTHROSCOPY Bilateral    2 on left, 2 on right  . NASAL SINUS SURGERY  2001  . TONSILLECTOMY    . TOTAL KNEE ARTHROPLASTY Left 09/16/2018  . TOTAL KNEE ARTHROPLASTY Left 09/16/2018   Procedure: LEFT TOTAL KNEE ARTHROPLASTY;  Surgeon: Meredith Pel, MD;  Location: [facility];  Service: Orthopedics;  Laterality: Left;   Social History   Occupational History  . Not on file  Tobacco Use  . Smoking status: Current Every Day Smoker    Packs/day: 0.50    Types: Cigarettes  . Smokeless tobacco: Never Used  Substance and Sexual Activity  . Alcohol use: No  . Drug use: No  . Sexual activity: Not Currently

## 2018-12-15 ENCOUNTER — Encounter: Payer: Self-pay | Admitting: Orthopedic Surgery

## 2018-12-15 ENCOUNTER — Ambulatory Visit (INDEPENDENT_AMBULATORY_CARE_PROVIDER_SITE_OTHER): Payer: BC Managed Care – PPO | Admitting: Orthopedic Surgery

## 2018-12-15 DIAGNOSIS — Z96652 Presence of left artificial knee joint: Secondary | ICD-10-CM

## 2018-12-15 NOTE — Progress Notes (Signed)
   Post-Op Visit Note   Patient: Lauren Hicks           Date of Birth: February 10, 1976           MRN: [id number] Visit Date: 12/15/2018 PCP: Patient, No Pcp Per   Assessment & Plan:  Chief Complaint:  Chief Complaint  Patient presents with  . Follow-up   Visit Diagnoses:  1. Status post total left knee replacement     Plan: Pachia is now about 3 months out left total knee replacement.  She is doing well.  Ambulating with a cane.  She is in therapy 3 times a week.  She has 4 more weeks.  Taking Ultram and anti-inflammatory daily.  She is reporting some severe right knee pain.  Injections only helped for 1 day.  She is considering getting the right knee done at sometime in the future.  On exam she has extension 0 and flexion to 100.  She has improving strength.  Plan at this time is to continue with therapy for 4 more weeks and follow-up with me as needed.  She is likely considering getting that right knee done at some point in time.  We will see her back as needed.  Need for antibiotic prophylaxis discussed.  Follow-Up Instructions: Return if symptoms worsen or fail to improve.   Orders:  No orders of the defined types were placed in this encounter.  No orders of the defined types were placed in this encounter.   Imaging: No results found.  PMFS History: Patient Active Problem List   Diagnosis Date Noted  . Arthritis of knee 09/16/2018  . Palpitations 07/14/2018  . History of endometriosis 11/01/2017  . Abnormal maternal glucose tolerance, antepartum 07/26/2004  . Supervision of normal first pregnancy 07/26/2004   Past Medical History:  Diagnosis Date  . Arthritis   . Complication of anesthesia    nausea and vomiting with C-section in 2012, also took a long time for spinal to wear off  . Headache    migraines  . Hypertension   . PONV (postoperative nausea and vomiting)     Family History  Problem Relation Age of Onset  . Diabetes Mother   . Broken bones Mother    . Osteoarthritis Father   . Pancreatitis Father     Past Surgical History:  Procedure Laterality Date  . CESAREAN SECTION    . CHOLECYSTECTOMY    . DIAGNOSTIC LAPAROSCOPY  2004   endometriosis  . KNEE ARTHROSCOPY Bilateral    2 on left, 2 on right  . NASAL SINUS SURGERY  2001  . TONSILLECTOMY    . TOTAL KNEE ARTHROPLASTY Left 09/16/2018  . TOTAL KNEE ARTHROPLASTY Left 09/16/2018   Procedure: LEFT TOTAL KNEE ARTHROPLASTY;  Surgeon: Meredith Pel, MD;  Location: [facility];  Service: Orthopedics;  Laterality: Left;   Social History   Occupational History  . Not on file  Tobacco Use  . Smoking status: Current Every Day Smoker    Packs/day: 0.50    Types: Cigarettes  . Smokeless tobacco: Never Used  Substance and Sexual Activity  . Alcohol use: No  . Drug use: No  . Sexual activity: Not Currently

## 2019-01-26 ENCOUNTER — Telehealth: Payer: Self-pay | Admitting: Orthopedic Surgery

## 2019-01-26 ENCOUNTER — Other Ambulatory Visit: Payer: Self-pay | Admitting: Surgical

## 2019-01-26 MED ORDER — AMOXICILLIN 500 MG PO TABS: ORAL_TABLET | ORAL | 0 refills | Status: AC

## 2019-01-26 NOTE — Telephone Encounter (Signed)
Patient called and left a message on the voicemail stating that she will be going to the dentist tomorrow and will need antibiotics called in. Her call back number is [phone]

## 2019-01-26 NOTE — Telephone Encounter (Signed)
Please advise 

## 2019-02-02 ENCOUNTER — Telehealth: Payer: Self-pay | Admitting: Radiology

## 2019-02-02 NOTE — Telephone Encounter (Signed)
pls advise. thx  

## 2019-02-02 NOTE — Telephone Encounter (Signed)
Lauren Hicks with [facility] called stating patient brought in rx for Oxycodone dated 10/01/2018.  She would like to know if this is ok to fill since it was written so long ago.  Please return call to [phone]

## 2019-02-03 NOTE — Telephone Encounter (Signed)
ICI LM for pharmacy advising.

## 2020-05-03 IMAGING — MR MRI OF THE LEFT KNEE WITHOUT CONTRAST
4 of 7 series · 21 of 40 positions shown · non-contrast
Comparison: Radiographs 07/14/2018.

CLINICAL DATA: Knee pain and swelling following injury 4 weeks ago.
Patient felt a pop. Limited knee extension.

EXAM:
MRI OF THE LEFT KNEE WITHOUT CONTRAST
TECHNIQUE: Multiplanar, multisequence MR imaging of the knee was performed. No
intravenous contrast was administered.

[Series 6: T1 · coronal · 4.0mm · 0.35mm/px · 6 of 28 slices shown]
[im 1/28]
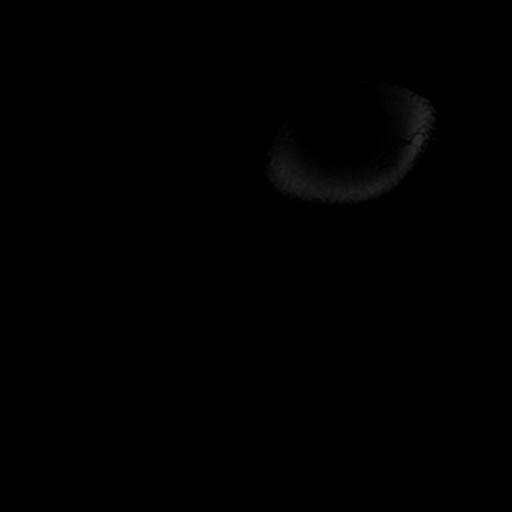
[im 6/28]
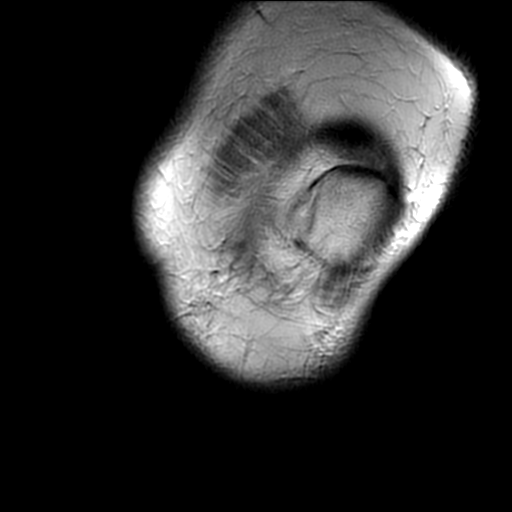
[im 11/28]
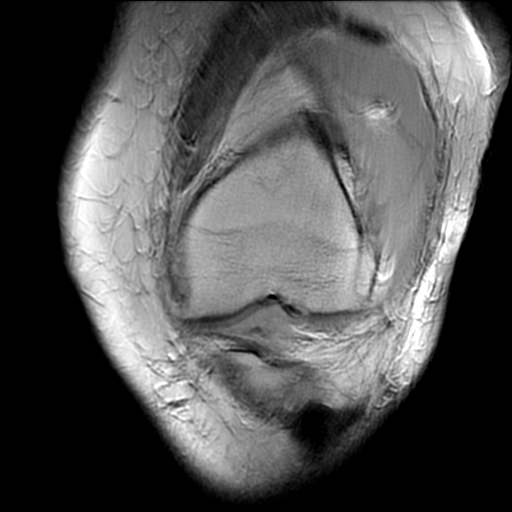
[im 17/28]
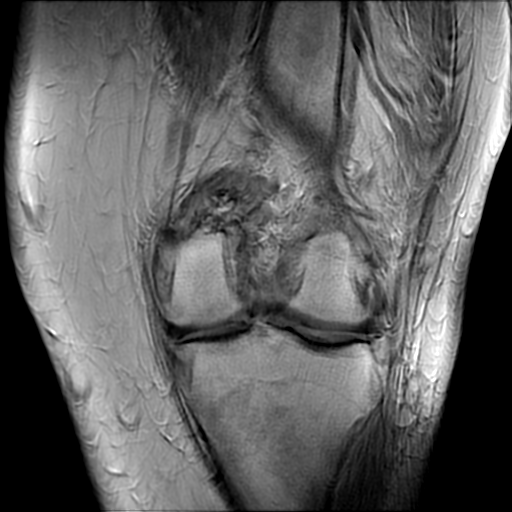
[im 22/28]
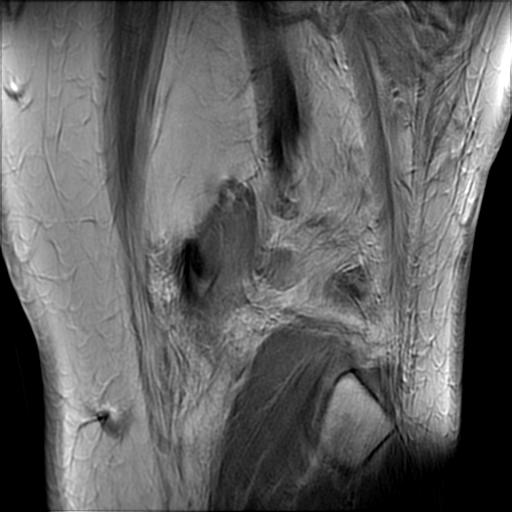
[im 28/28]
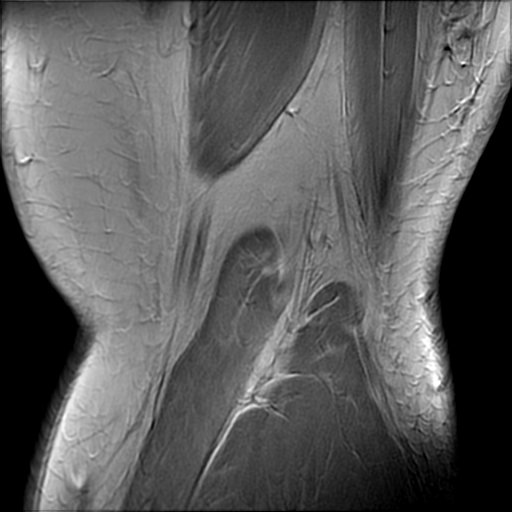

[Series 9: PD fat-sat · coronal · 4.0mm · 0.35mm/px · 6 of 28 slices shown (1 of 3)]
[im 1/28]
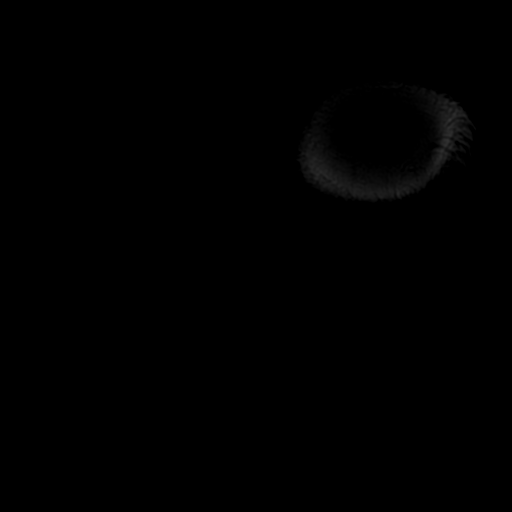
[im 6/28]
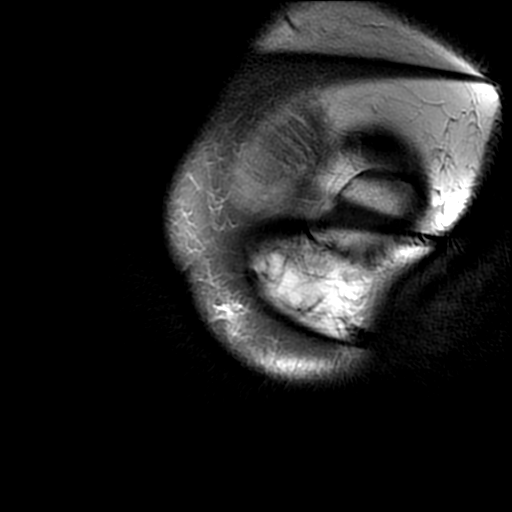
[im 11/28]
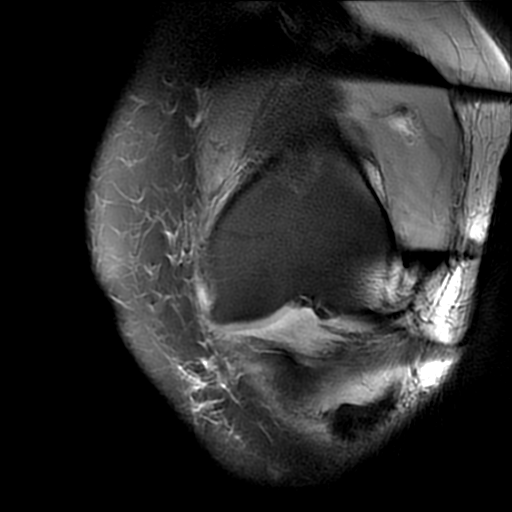
[im 17/28]
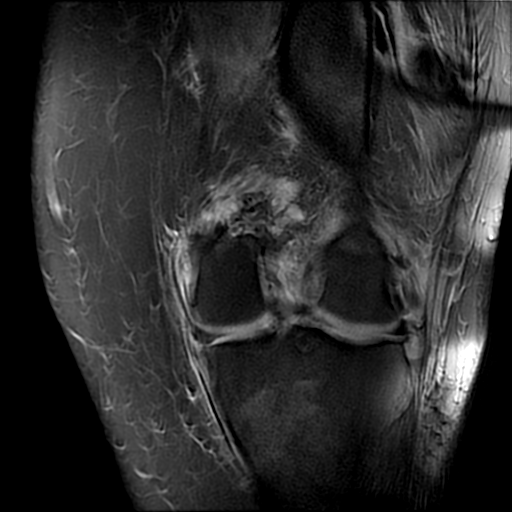
[im 22/28]
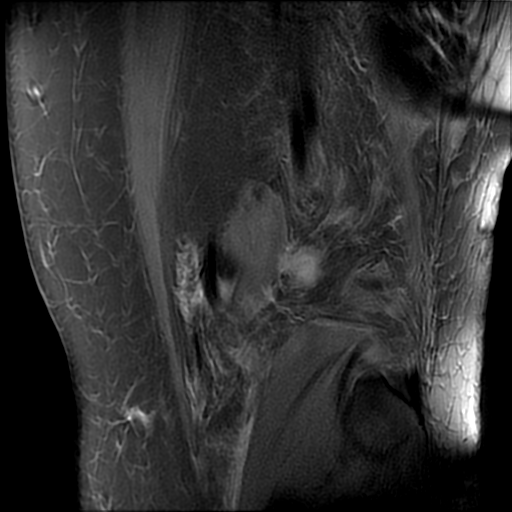
[im 28/28]
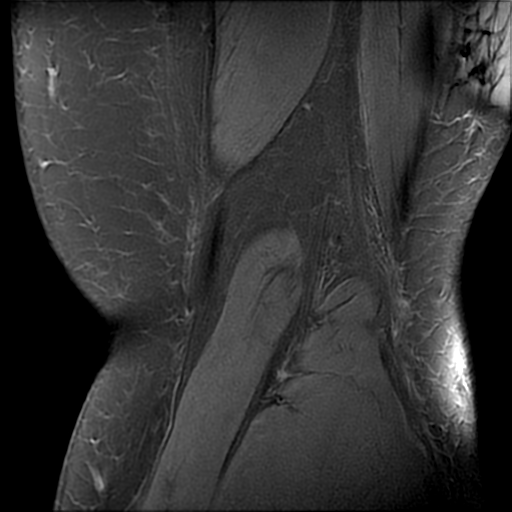

[Series 11: PD fat-sat · sagittal · 3.0mm · 0.35mm/px · 6 of 31 slices shown (2 of 3)]
[im 1/31]
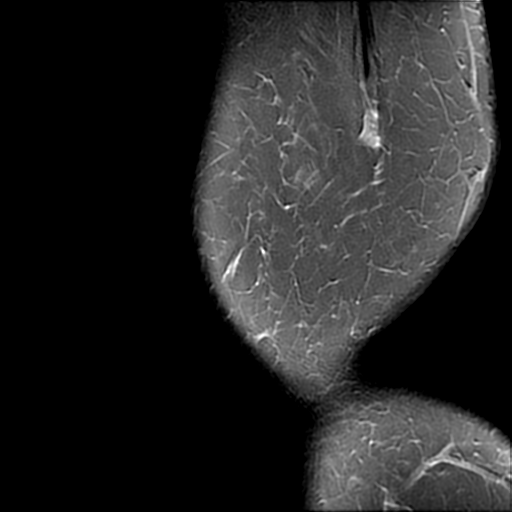
[im 7/31]
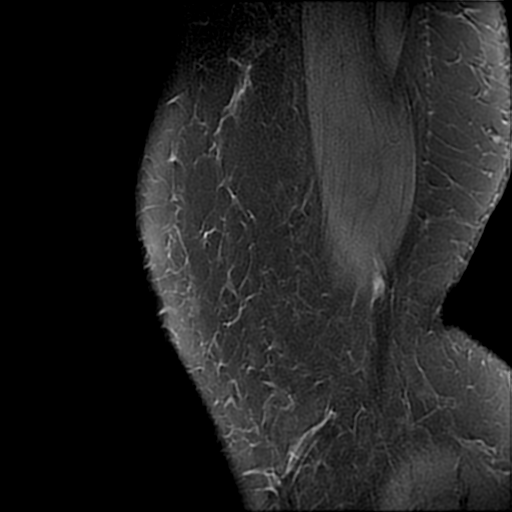
[im 13/31]
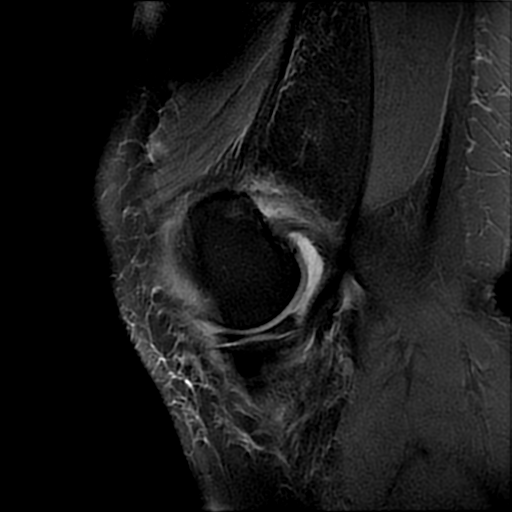
[im 19/31]
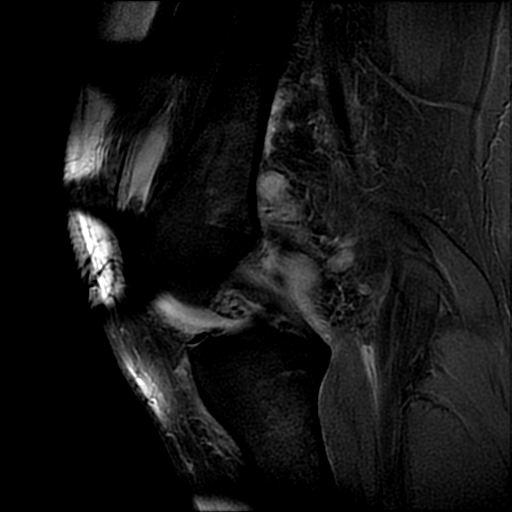
[im 25/31]
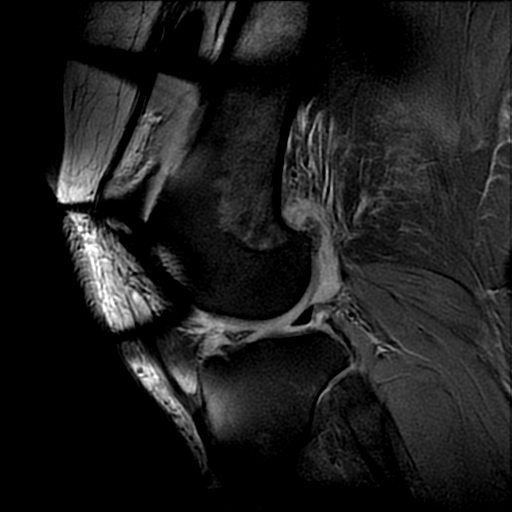
[im 31/31]
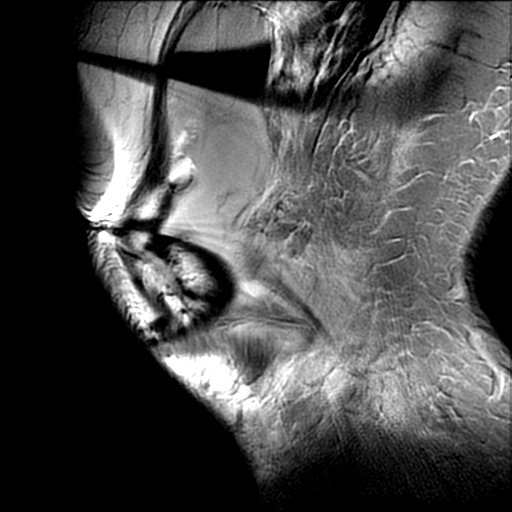

[Series 12: PD fat-sat · oblique · 2.0mm · 0.70mm/px · 3 of 13 slices shown (3 of 3)]
[im 1/13]
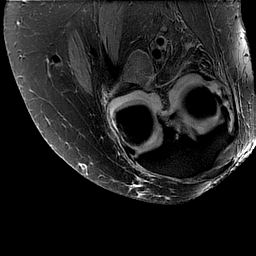
[im 7/13]
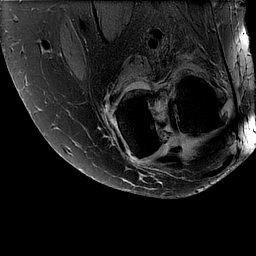
[im 13/13]
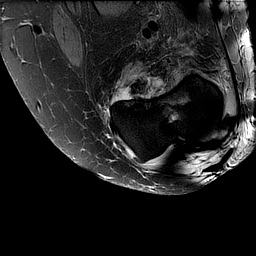

[21 of 40 positions shown; findings below may reference images not displayed]

FINDINGS: MENISCI

Medial meniscus:  Intact with normal morphology.

Lateral meniscus:  Intact with normal morphology.

LIGAMENTS

Cruciates:  Intact.

Collaterals:  Intact.

CARTILAGE

Patellofemoral: There are age advanced degenerative changes with
diffuse chondral thinning, subchondral cyst and osteophyte
formation. The patella is laterally subluxed relative to the
trochlea.

Medial: Mild chondral thinning with peripheral osteophyte formation.

Lateral: Mild chondral thinning with peripheral osteophyte
formation.

MISCELLANEOUS

Joint: Moderate size complex joint effusion with synovial
thickening. There is a 12 mm intra-articular loose body
posterolaterally.

Popliteal Fossa:  Unremarkable. No significant Baker's cyst.

Extensor Mechanism: Intact. As above, there is lateral subluxation
at the patellofemoral joint. The patellofemoral ligaments are
intact.

Bones:  No acute or significant extra-articular osseous findings.

Other: Mild subcutaneous edema surrounding the knee without focal
fluid collection.
IMPRESSION: 1. No acute posttraumatic findings are identified. The menisci,
cruciate and collateral ligaments are intact.
2. Age advanced tricompartmental degenerative changes, greatest in
the patellofemoral compartment. Associated complex joint effusion
with loose body posterolaterally.
